# Patient Record
Sex: Male | Born: 1940
Health system: Southern US, Community
[De-identification: ages and names within clinical notes are randomized; demographics above are authoritative.]

## PROBLEM LIST (undated history)

## (undated) DIAGNOSIS — Z872 Personal history of diseases of the skin and subcutaneous tissue: Secondary | ICD-10-CM

## (undated) DIAGNOSIS — H9190 Unspecified hearing loss, unspecified ear: Secondary | ICD-10-CM

## (undated) DIAGNOSIS — E785 Hyperlipidemia, unspecified: Secondary | ICD-10-CM

## (undated) DIAGNOSIS — I10 Essential (primary) hypertension: Secondary | ICD-10-CM

## (undated) HISTORY — PX: TONSILLECTOMY: SHX5217

## (undated) HISTORY — DX: Personal history of diseases of the skin and subcutaneous tissue: Z87.2

## (undated) HISTORY — DX: Hyperlipidemia, unspecified: E78.5

## (undated) HISTORY — DX: Unspecified hearing loss, unspecified ear: H91.90

## (undated) HISTORY — PX: INNER EAR SURGERY: SHX679

## (undated) HISTORY — DX: Essential (primary) hypertension: I10

---

## 2009-02-08 ENCOUNTER — Ambulatory Visit: Payer: Self-pay | Admitting: Family Medicine

## 2009-02-16 ENCOUNTER — Ambulatory Visit: Payer: Self-pay | Admitting: Family Medicine

## 2009-06-13 ENCOUNTER — Encounter: Payer: Self-pay | Admitting: Internal Medicine

## 2009-06-19 ENCOUNTER — Ambulatory Visit: Payer: Self-pay | Admitting: Internal Medicine

## 2009-06-19 DIAGNOSIS — H9190 Unspecified hearing loss, unspecified ear: Secondary | ICD-10-CM

## 2009-06-19 HISTORY — DX: Unspecified hearing loss, unspecified ear: H91.90

## 2009-06-19 LAB — CONVERTED CEMR LAB
Bilirubin Urine: NEGATIVE
Ketones, urine, test strip: NEGATIVE
Nitrite: NEGATIVE
Specific Gravity, Urine: 1.01
Urobilinogen, UA: 0.2

## 2009-06-22 ENCOUNTER — Encounter: Payer: Self-pay | Admitting: Internal Medicine

## 2009-07-27 ENCOUNTER — Encounter: Payer: Self-pay | Admitting: Internal Medicine

## 2009-08-07 ENCOUNTER — Encounter: Payer: Self-pay | Admitting: Internal Medicine

## 2009-08-09 ENCOUNTER — Encounter: Payer: Self-pay | Admitting: Internal Medicine

## 2009-09-13 ENCOUNTER — Encounter: Payer: Self-pay | Admitting: Internal Medicine

## 2009-09-16 ENCOUNTER — Encounter: Payer: Self-pay | Admitting: Internal Medicine

## 2009-09-19 ENCOUNTER — Encounter: Payer: Self-pay | Admitting: Internal Medicine

## 2009-09-24 ENCOUNTER — Telehealth: Payer: Self-pay | Admitting: Internal Medicine

## 2009-10-05 ENCOUNTER — Telehealth: Payer: Self-pay | Admitting: Internal Medicine

## 2009-11-02 ENCOUNTER — Encounter: Payer: Self-pay | Admitting: Internal Medicine

## 2009-12-14 ENCOUNTER — Ambulatory Visit: Payer: Self-pay | Admitting: Internal Medicine

## 2010-08-12 ENCOUNTER — Telehealth: Payer: Self-pay | Admitting: Family Medicine

## 2010-08-15 ENCOUNTER — Ambulatory Visit: Payer: Self-pay | Admitting: Family Medicine

## 2010-08-15 LAB — CONVERTED CEMR LAB: Prothrombin Time: 10.6 s (ref 9.7–11.8)

## 2010-10-06 LAB — CONVERTED CEMR LAB
ALT: 22 units/L (ref 0–53)
BUN: 13 mg/dL (ref 6–23)
Bilirubin Urine: NEGATIVE
Bilirubin, Direct: 0.1 mg/dL (ref 0.0–0.3)
CO2: 30 meq/L (ref 19–32)
Chloride: 105 meq/L (ref 96–112)
Cholesterol: 206 mg/dL — ABNORMAL HIGH (ref 0–200)
Creatinine, Ser: 0.9 mg/dL (ref 0.4–1.5)
Direct LDL: 138.5 mg/dL
Eosinophils Absolute: 0.3 10*3/uL (ref 0.0–0.7)
Eosinophils Relative: 5.3 % — ABNORMAL HIGH (ref 0.0–5.0)
Glucose, Bld: 82 mg/dL (ref 70–99)
Glucose, Urine, Semiquant: NEGATIVE
HCT: 41 % (ref 39.0–52.0)
Lymphs Abs: 1.5 10*3/uL (ref 0.7–4.0)
MCHC: 34.8 g/dL (ref 30.0–36.0)
MCV: 100.7 fL — ABNORMAL HIGH (ref 78.0–100.0)
Monocytes Absolute: 0.4 10*3/uL (ref 0.1–1.0)
Neutrophils Relative %: 57.5 % (ref 43.0–77.0)
PSA: 2.77 ng/mL (ref 0.10–4.00)
Platelets: 139 10*3/uL — ABNORMAL LOW (ref 150.0–400.0)
Potassium: 4.4 meq/L (ref 3.5–5.1)
Protein, U semiquant: NEGATIVE
RDW: 12.4 % (ref 11.5–14.6)
Specific Gravity, Urine: 1.025
TSH: 2.57 microintl units/mL (ref 0.35–5.50)
Total Bilirubin: 1.2 mg/dL (ref 0.3–1.2)
VLDL: 33.4 mg/dL (ref 0.0–40.0)
WBC Urine, dipstick: NEGATIVE
pH: 6

## 2010-10-09 NOTE — Progress Notes (Signed)
Summary: PHONE  Phone Note Call from Patient Call back at Work Phone [phone]   Caller: Patient Summary of Call: PATIENT IS CALLING AND WANTS TO KNOW IF DR Deneise Getty SENT THE LETTER FROM ENT IN REFERENCE TO THE W-22 WORD LIST. PATIENT WOULD LIKE A CALL BACK. PLEASE ADVISE Initial call taken by: Barb Merino,  October 05, 2009 3:00 PM  Follow-up for Phone Call        dr Kanaya Gunnarson pls advise   if LETTER FROM DR. DELEGRANGE ABOUT HIS 3RD AUDIOLOGIC TEST, HAS BEEN  SENT TO  FAA.............Marland KitchenFelecia Deloach CMA  October 05, 2009 3:13 PM   Additional Follow-up for Phone Call Additional follow up Details #1::        all data sent to [facility] recommending certificate be issued Additional Follow-up by: Marga Melnick MD,  October 05, 2009 4:41 PM    Additional Follow-up for Phone Call Additional follow up Details #2::    pt aware..............Marland KitchenFelecia Deloach CMA  October 05, 2009 5:03 PM

## 2010-10-09 NOTE — Therapy (Signed)
Summary: Audiometry/[facility]  Audiometry/[facility]   Imported By: Lanelle Bal 10/03/2009 11:37:57  _____________________________________________________________________  External Attachment:    Type:   Image     Comment:   External Document

## 2010-10-09 NOTE — Progress Notes (Signed)
Summary: LETTER TO FAA  Phone Note Call from Patient Call back at Work Phone [phone]   Caller: Patient Call For: Nahiara Kretzschmar Reason for Call: Talk to Nurse Summary of Call: PATIENT IS CALLING, STATES HE IS A FRIEND & PT OF DR. Chanie Soucek.  PT REQUESTS TO SPEAK W/DR. Raymie Trani TO IF HE HAS REC'D A LETTER FROM DR. DELEGRANGE ABOUT HIS 3RD AUDIOLOGIC TEST, AND IF SO, DID DR. Kellianne Ek SEND TO THE FAA.  PT REQUESTS DR. Charisma Charlot CALL HIM ASAP AT [phone] PLEASE.  Initial call taken by: Magdalen Spatz [facility],  September 24, 2009 9:44 AM  Follow-up for Phone Call        I recived all data ; I'llget it to [facility] ASAP Follow-up by: Marga Melnick MD,  September 24, 2009 1:26 PM  Additional Follow-up for Phone Call Additional follow up Details #1::        pt aware.................Marland KitchenFelecia Deloach CMA  September 24, 2009 2:51 PM

## 2010-10-09 NOTE — Letter (Signed)
Summary: E-Mail from Patient Regarding Hearing Exam for FAA Certification  E-Mail from Patient Regarding Hearing Exam for FAA Certification   Imported By: Lanelle Bal 09/25/2009 09:09:16  _____________________________________________________________________  External Attachment:    Type:   Image     Comment:   External Document

## 2010-10-09 NOTE — Letter (Signed)
Summary: Medical Certification Letter/FAA  Medical Certification Letter/FAA   Imported By: Lanelle Bal 11/15/2009 13:55:09  _____________________________________________________________________  External Attachment:    Type:   Image     Comment:   External Document

## 2010-10-09 NOTE — Assessment & Plan Note (Signed)
Summary: chest congestion/ mbw   Primary Provider/Referring Provider:  Alonza Smoker  CC:  Pulmonary new pt-chest congestion.Marland Kitchen  History of Present Illness: December 14, 2009- 69 yoM self referred at request of his wife because of an acute chest syndrome they are sharing. Never smoked. No obvious sick contact, but got chilled recently on trip to [facility]. Woke 1 week ago with raw feeling in chest, tussive substernal pain, fever, chills, yellow sputum. took bufferin and fever abated. Cough improved and he is most concerned (i.e. wife is concerned) about lingering weakness, anorexia. Pain is almost gone. Not dyspneic.  He had been having a persistent dry cough attributed to nasal drip for a few months- he minimizes. Has no pulmonary history and has not had flu vax or pneumonia vax.  He is a Occupational hygienist. FAA evaluation previously for eustachian dysfunction which he says has improved.  Current Medications (verified): 1)  Aspirin 81 Mg Tbec (Aspirin) .... 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  The patient denies shortness of breath with activity, shortness of breath at rest, non-productive cough, coughing up blood, chest pain, irregular heartbeats, acid heartburn, indigestion, weight change, abdominal pain, difficulty swallowing, sore throat, tooth/dental problems, headaches, sneezing, itching, ear ache, anxiety, depression, hand/feet swelling, joint stiffness or pain, rash, change in color of mucus, and fever.    Vital Signs:  Patient profile:   70 year old male Height:      69.5 inches Weight:      191.25 pounds O2 Sat:      95 % on Room air Pulse rate:   78 / minute BP sitting:   120 / 62  (left arm) Cuff size:   regular  Vitals Entered By: Reynaldo Minium CMA (December 14, 2009 1:32 PM)  O2 Flow:  Room air  Physical Exam  Additional Exam:  General: A/Ox3; pleasant and cooperative, NAD, wdwn SKIN: no rash, lesions NODES: no lymphadenopathy HEENT: Pope/AT, EOM- WNL, Conjuctivae- clear, PERRLA, TM-WNL, Nose- clear, Throat- clear and wnl NECK: Supple w/ fair ROM, JVD- none, normal carotid impulses w/o bruits Thyroid- normal to palpation CHEST: Clear to P&A, but raspy cough HEART: RRR, no m/g/r heard ABDOMEN: Soft and nl; QMV:HQIO, nl pulses, no edema  NEURO: Grossly intact to observation      Impression & Recommendations:  Problem # 1:  BRONCHITIS, ACUTE (ICD-466.0)  He is resolving an acute bronchitis. Suggest fluids, mucinex if needed, Zpak, pneumovax.  His updated medication list for this problem includes:    Zithromax Z-pak 250 Mg Tabs (Azithromycin) .Marland Kitchen... 2 today then one daily  Medications Added to Medication List This Visit: 1)  Aspirin  81 Mg Tbec (Aspirin) .... Take 2 by mouth  every morning 2)  Zithromax Z-pak 250 Mg Tabs (Azithromycin) .... 2 today then one daily  Other Orders: New Patient Level III (04540) Pneumococcal Vaccine (98119) Admin 1st Vaccine (14782)  Patient  Instructions: 1)  Please schedule a follow-up appointment as needed. 2)  Pneumovax 3)  Fluids, consider mucinex if needed for thick mucus 4)  script for Z pak Prescriptions: ZITHROMAX Z-PAK 250 MG TABS (AZITHROMYCIN) 2 today then one daily  #1 pak x 0   Entered and Authorized by:   Waymon Budge MD   Signed by:   Waymon Budge MD on 12/14/2009   Method used:   Print then Give to Patient   RxID:   [id number]    Immunizations Administered:  Pneumonia Vaccine:    Vaccine Type: Pneumovax    Site: left deltoid    Mfr: Merck    Dose: 0.5 ml    Route: IM    Given by: Randell Loop CMA    Exp. Date: 04/20/2011    Lot #: [id number]    VIS given: 04/05/96 version given December 14, 2009.

## 2010-10-09 NOTE — Letter (Signed)
Summary: Letter Regarding Audiology Records & FAA Certification/Terry Del  Letter Regarding Audiology Records & FAA Certification/Terry Delagrange MD   Imported By: Lanelle Bal 10/03/2009 11:56:55  _____________________________________________________________________  External Attachment:    Type:   Image     Comment:   External Document

## 2010-10-09 NOTE — Therapy (Signed)
Summary: Audiometry/[facility]  Audiometry/[facility]   Imported By: Lanelle Bal 10/03/2009 11:50:38  _____________________________________________________________________  External Attachment:    Type:   Image     Comment:   External Document

## 2010-10-09 NOTE — Letter (Signed)
Summary: Medical Certificate & Student Pilot Certificate/FAA  Medical Certificate & Student Pilot Certificate/FAA   Imported By: Lanelle Bal 10/05/2009 11:56:02  _____________________________________________________________________  External Attachment:    Type:   Image     Comment:   External Document

## 2010-10-09 NOTE — Therapy (Signed)
Summary: Audiometry/[facility]  Audiometry/[facility]   Imported By: Lanelle Bal 10/03/2009 11:49:32  _____________________________________________________________________  External Attachment:    Type:   Image     Comment:   External Document

## 2010-10-09 NOTE — Letter (Signed)
Summary: E-Mail  Regarding Hearing Eval for FAA Certification  E-Mail  Regarding Hearing Eval for FAA Certification   Imported By: Lanelle Bal 09/25/2009 10:43:19  _____________________________________________________________________  External Attachment:    Type:   Image     Comment:   External Document

## 2010-10-09 NOTE — Progress Notes (Signed)
Summary: REQUEST FOR LABWORK  Phone Note Call from Patient   Caller: Patient  -   [phone] Summary of Call: Pt adv that he has an order from Dr. Trixie Rude, [address]...Marland KitchenMarland KitchenMarland Kitchen Pt will be having surgery and has to have labwork (PT, PTT).... would like to have labwork done at [facility] or [facility] location..... Can you advise okay for labwork?  Initial call taken by: Debbra Riding,  August 12, 2010 3:24 PM  Follow-up for Phone Call        since he is a patient here we do all his lab work  Follow-up by: Roderick Pee MD,  August 12, 2010 5:54 PM  Additional Follow-up for Phone Call Additional follow up Details #1::        Contacted pt and lab appt was scheduled for 12/8 at [facility] location.  Additional Follow-up by: Debbra Riding,  August 13, 2010 8:02 AM

## 2010-10-09 NOTE — Letter (Signed)
Summary: [facility]  [facility]   Imported By: Lanelle Bal 10/03/2009 11:52:45  _____________________________________________________________________  External Attachment:    Type:   Image     Comment:   External Document

## 2011-02-12 ENCOUNTER — Encounter: Payer: Self-pay | Admitting: Family Medicine

## 2011-02-13 ENCOUNTER — Encounter: Payer: Self-pay | Admitting: Family Medicine

## 2011-02-13 ENCOUNTER — Ambulatory Visit (INDEPENDENT_AMBULATORY_CARE_PROVIDER_SITE_OTHER): Payer: Medicare Other | Admitting: Family Medicine

## 2011-02-13 VITALS — BP 120/84 | Temp 98.3°F | Ht 69.25 in | Wt 190.0 lb

## 2011-02-13 DIAGNOSIS — Z125 Encounter for screening for malignant neoplasm of prostate: Secondary | ICD-10-CM

## 2011-02-13 DIAGNOSIS — Z0000 Encounter for general adult medical examination without abnormal findings: Secondary | ICD-10-CM

## 2011-02-13 LAB — POCT URINALYSIS DIPSTICK
Ketones, UA: NEGATIVE
Leukocytes, UA: NEGATIVE
Protein, UA: NEGATIVE
Urobilinogen, UA: 0.2
pH, UA: 5.5

## 2011-02-13 LAB — CBC WITH DIFFERENTIAL/PLATELET
Basophils Absolute: 0 10*3/uL (ref 0.0–0.1)
Basophils Relative: 0.3 % (ref 0.0–3.0)
Eosinophils Absolute: 0.1 10*3/uL (ref 0.0–0.7)
Hemoglobin: 14.8 g/dL (ref 13.0–17.0)
MCHC: 35 g/dL (ref 30.0–36.0)
MCV: 99.8 fl (ref 78.0–100.0)
Monocytes Absolute: 0.4 10*3/uL (ref 0.1–1.0)
Neutro Abs: 3.8 10*3/uL (ref 1.4–7.7)
RBC: 4.22 Mil/uL (ref 4.22–5.81)
RDW: 13.3 % (ref 11.5–14.6)

## 2011-02-13 LAB — HEPATIC FUNCTION PANEL
Albumin: 4.6 g/dL (ref 3.5–5.2)
Total Protein: 7.4 g/dL (ref 6.0–8.3)

## 2011-02-13 LAB — LIPID PANEL
HDL: 51.9 mg/dL (ref >39.00)
Total CHOL/HDL Ratio: 5
Triglycerides: 101 mg/dL (ref 0.0–149.0)
VLDL: 20.2 mg/dL (ref 0.0–40.0)

## 2011-02-13 LAB — PSA: PSA: 3.6 ng/mL (ref 0.10–4.00)

## 2011-02-13 MED ORDER — SILDENAFIL CITRATE 50 MG PO TABS: 50.0000 mg | ORAL_TABLET | ORAL | Status: AC | PRN

## 2011-02-13 NOTE — Patient Instructions (Signed)
Continue good health habits.  We will get u  setup for a screening colonoscopy and consider the shingles  Return in one year for general medical exam, sooner if any problems.  viagra 50 mg ,,,,,,,,,,,,,,,, one half tablet one hour prior to sex with water

## 2011-02-13 NOTE — Progress Notes (Signed)
  Subjective:    Patient ID: Anthony Mendez, male    DOB: Dec 24, 1940, 70 y.o.   MRN: [id number]  HPI [name] is a delightful 70 year old, married man nonsmoker, who comes in today for general Medicare wellness examination.  Is always been in excellent health.  He said no chronic health problems.  He takes no medication on a regular basis except an 81 mg baby aspirin.  He gets routine eye care, dental care, tetanus, 2005, Pneumovax, x 2, information given on shingles.  Vaccinations and will set him up for colonoscopy.  He does see the dermatologist yearly because he has chronic sun damage.  He is a [employer] manager.  And he has slight skin in line 9.  He still works on a daily basis.  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  No guns in the house, home safety reviewed.  No issues identified.  He does have a living will and a healthcare power of attorney.  He exercises on a regular basis.   Review of Systems  Constitutional: Negative.   HENT: Negative.   Eyes: Negative.   Respiratory: Negative.   Cardiovascular: Negative.   Gastrointestinal: Negative.   Genitourinary: Negative.   Musculoskeletal: Negative.   Skin: Negative.   Neurological: Negative.   Hematological: Negative.   Psychiatric/Behavioral: Negative.        Objective:   Physical Exam  Constitutional: He is oriented to person, place, and time. He appears well-developed and well-nourished.  HENT:  Head: Normocephalic and atraumatic.  Right Ear: External ear normal.  Left Ear: External ear normal.  Nose: Nose normal.  Mouth/Throat: Oropharynx is clear and moist.  Eyes: Conjunctivae and EOM are normal. Pupils are equal, round, and reactive to light.  Neck: Normal range of motion. Neck supple. No JVD present. No tracheal deviation present. No thyromegaly present.  Cardiovascular: Normal rate, regular rhythm, normal heart sounds and intact distal pulses.  Exam reveals no gallop and no friction rub.   No murmur  heard. Pulmonary/Chest: Effort normal and breath sounds normal. No stridor. No respiratory distress. He has no wheezes. He has no rales. He exhibits no tenderness.  Abdominal: Soft. Bowel sounds are normal. He exhibits no distension and no mass. There is no tenderness. There is no rebound and no guarding.  Genitourinary: Rectum normal, prostate normal and penis normal. Guaiac negative stool. No penile tenderness.  Musculoskeletal: Normal range of motion. He exhibits no edema and no tenderness.  Lymphadenopathy:    He has no cervical adenopathy.  Neurological: He is alert and oriented to person, place, and time. He has normal reflexes. No cranial nerve deficit. He exhibits normal muscle tone.  Skin: Skin is warm and dry. No rash noted. No erythema. No pallor.       Chronic sun damage  Psychiatric: He has a normal mood and affect. His behavior is normal. Judgment and thought content normal.          Assessment & Plan:  Healthy male.  Mild ED,,,,,,,,,v prn   Chronic sun damage.  Recommend sunscreens yearly dermatologic evaluation

## 2011-02-28 NOTE — Progress Notes (Signed)
Left message on machine for patient

## 2011-04-14 ENCOUNTER — Encounter: Payer: Self-pay | Admitting: Gastroenterology

## 2012-04-02 ENCOUNTER — Other Ambulatory Visit (INDEPENDENT_AMBULATORY_CARE_PROVIDER_SITE_OTHER): Payer: Medicare Other

## 2012-04-02 DIAGNOSIS — Z125 Encounter for screening for malignant neoplasm of prostate: Secondary | ICD-10-CM

## 2012-04-02 DIAGNOSIS — Z0000 Encounter for general adult medical examination without abnormal findings: Secondary | ICD-10-CM

## 2012-04-02 DIAGNOSIS — Z79899 Other long term (current) drug therapy: Secondary | ICD-10-CM

## 2012-04-02 LAB — HEPATIC FUNCTION PANEL
Albumin: 4 g/dL (ref 3.5–5.2)
Total Bilirubin: 0.8 mg/dL (ref 0.3–1.2)

## 2012-04-02 LAB — CBC WITH DIFFERENTIAL/PLATELET
Basophils Absolute: 0 10*3/uL (ref 0.0–0.1)
Eosinophils Absolute: 0.3 10*3/uL (ref 0.0–0.7)
HCT: 41.6 % (ref 39.0–52.0)
Hemoglobin: 13.9 g/dL (ref 13.0–17.0)
Lymphocytes Relative: 26.2 % (ref 12.0–46.0)
Lymphs Abs: 1.4 10*3/uL (ref 0.7–4.0)
MCHC: 33.4 g/dL (ref 30.0–36.0)
Neutro Abs: 3.2 10*3/uL (ref 1.4–7.7)
Platelets: 131 10*3/uL — ABNORMAL LOW (ref 150.0–400.0)
RDW: 13.2 % (ref 11.5–14.6)

## 2012-04-02 LAB — POCT URINALYSIS DIPSTICK
Bilirubin, UA: NEGATIVE
Blood, UA: NEGATIVE
Glucose, UA: NEGATIVE
Leukocytes, UA: NEGATIVE
Nitrite, UA: NEGATIVE

## 2012-04-02 LAB — BASIC METABOLIC PANEL
BUN: 13 mg/dL (ref 6–23)
CO2: 26 mEq/L (ref 19–32)
Calcium: 9.3 mg/dL (ref 8.4–10.5)
GFR: 81.94 mL/min (ref >60.00)
Glucose, Bld: 104 mg/dL — ABNORMAL HIGH (ref 70–99)
Sodium: 138 mEq/L (ref 135–145)

## 2012-04-02 LAB — LIPID PANEL
Cholesterol: 206 mg/dL — ABNORMAL HIGH (ref 0–200)
HDL: 47.5 mg/dL (ref >39.00)
Triglycerides: 183 mg/dL — ABNORMAL HIGH (ref 0.0–149.0)

## 2012-04-02 LAB — PSA: PSA: 2.92 ng/mL (ref 0.10–4.00)

## 2012-04-02 LAB — TSH: TSH: 1.98 u[IU]/mL (ref 0.35–5.50)

## 2012-04-08 ENCOUNTER — Other Ambulatory Visit: Payer: Medicare Other

## 2012-04-22 ENCOUNTER — Encounter: Payer: Self-pay | Admitting: Family Medicine

## 2012-04-22 ENCOUNTER — Ambulatory Visit (INDEPENDENT_AMBULATORY_CARE_PROVIDER_SITE_OTHER): Payer: Medicare Other | Admitting: Family Medicine

## 2012-04-22 VITALS — BP 124/80 | Temp 98.3°F | Ht 70.25 in | Wt 195.0 lb

## 2012-04-22 DIAGNOSIS — H9190 Unspecified hearing loss, unspecified ear: Secondary | ICD-10-CM

## 2012-04-22 DIAGNOSIS — Z0000 Encounter for general adult medical examination without abnormal findings: Secondary | ICD-10-CM | POA: Insufficient documentation

## 2012-04-22 MED ORDER — FLUOCINONIDE 0.05 % EX SOLN: CUTANEOUS | Status: DC

## 2012-04-22 NOTE — Progress Notes (Signed)
  Subjective:    Patient ID: Anthony Mendez, male    DOB: 1941/07/07, 71 y.o.   MRN: [id number]  HPI Anthony Mendez is a 71 year old married male nonsmoker who comes in today for a Medicare wellness examination  He takes no medication for any chronic health problems. He does take an aspirin tablet daily for cardiac protection. His weight is stable blood pressure is normal blood sugar cholesterol lipids all have been within normal range for many years.  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   HENT: Negative.   Eyes: Negative.   Respiratory: Negative.   Cardiovascular: Negative.   Gastrointestinal: Negative.   Genitourinary: Negative.   Musculoskeletal: Negative.   Skin: Negative.   Neurological: Negative.   Hematological: Negative.   Psychiatric/Behavioral: Negative.        Objective:   Physical Exam  Constitutional: He is oriented to person, place, and time. He appears well-developed and well-nourished.  HENT:  Head: Normocephalic and atraumatic.  Right Ear: External ear normal.  Left Ear: External ear normal.  Nose: Nose normal.  Mouth/Throat: Oropharynx is clear and moist.       Irritated ear canals also some slight hearing loss  Eyes: Conjunctivae and EOM are normal. Pupils are equal, round, and reactive to light.  Neck: Normal range of motion. Neck supple. No JVD present. No tracheal deviation present. No thyromegaly present.  Cardiovascular: Normal rate, regular rhythm, normal heart sounds and intact distal pulses.  Exam reveals no gallop and no  friction rub.   No murmur heard. Pulmonary/Chest: Effort normal and breath sounds normal. No stridor. No respiratory distress. He has no wheezes. He has no rales. He exhibits no tenderness.  Abdominal: Soft. Bowel sounds are normal. He exhibits no distension and no mass. There is no tenderness. There is no rebound and no guarding.  Genitourinary: Rectum normal, prostate normal and penis normal. Guaiac negative stool. No penile tenderness.  Musculoskeletal: Normal range of motion. He exhibits no edema and no tenderness.  Lymphadenopathy:    He has no cervical adenopathy.  Neurological: He is alert and oriented to person, place, and time. He has normal reflexes. No cranial nerve deficit. He exhibits normal muscle tone.  Skin: Skin is warm and dry. No rash noted. No erythema. No pallor.  Psychiatric: He has a normal mood and affect. His behavior is normal. Judgment and thought content normal.          Assessment & Plan:  Healthy male  Otitis externa steroid drops  Hearing loss suggests hearing evaluation

## 2012-04-22 NOTE — Patient Instructions (Signed)
Remember to use your SPF 50+ sunscreens  Continue good health habits  The steroid solution 1 drop in each ear canal bedtime when necessary  Return in one year sooner if any problems

## 2013-04-27 ENCOUNTER — Other Ambulatory Visit: Payer: Medicare Other

## 2013-05-03 ENCOUNTER — Encounter: Payer: Medicare Other | Admitting: Family Medicine

## 2013-05-26 ENCOUNTER — Other Ambulatory Visit: Payer: Medicare Other

## 2013-05-31 ENCOUNTER — Encounter: Payer: Medicare Other | Admitting: Family Medicine

## 2013-06-22 ENCOUNTER — Encounter: Payer: Medicare Other | Admitting: Family Medicine

## 2013-07-18 ENCOUNTER — Other Ambulatory Visit (INDEPENDENT_AMBULATORY_CARE_PROVIDER_SITE_OTHER): Payer: Medicare Other

## 2013-07-18 DIAGNOSIS — Z125 Encounter for screening for malignant neoplasm of prostate: Secondary | ICD-10-CM

## 2013-07-18 DIAGNOSIS — Z0000 Encounter for general adult medical examination without abnormal findings: Secondary | ICD-10-CM

## 2013-07-18 DIAGNOSIS — Z136 Encounter for screening for cardiovascular disorders: Secondary | ICD-10-CM

## 2013-07-18 LAB — CBC WITH DIFFERENTIAL/PLATELET
Basophils Absolute: 0 10*3/uL (ref 0.0–0.1)
Eosinophils Absolute: 0.2 10*3/uL (ref 0.0–0.7)
Lymphocytes Relative: 26.5 % (ref 12.0–46.0)
MCHC: 34.3 g/dL (ref 30.0–36.0)
MCV: 99.2 fl (ref 78.0–100.0)
Monocytes Absolute: 0.3 10*3/uL (ref 0.1–1.0)
Neutrophils Relative %: 60.8 % (ref 43.0–77.0)
RDW: 13.6 % (ref 11.5–14.6)

## 2013-07-18 LAB — HEPATIC FUNCTION PANEL
Bilirubin, Direct: 0.1 mg/dL (ref 0.0–0.3)
Total Bilirubin: 1.1 mg/dL (ref 0.3–1.2)
Total Protein: 6.7 g/dL (ref 6.0–8.3)

## 2013-07-18 LAB — POCT URINALYSIS DIPSTICK
Bilirubin, UA: NEGATIVE
Ketones, UA: NEGATIVE
Leukocytes, UA: NEGATIVE
Protein, UA: NEGATIVE
Spec Grav, UA: 1.025

## 2013-07-18 LAB — LIPID PANEL
HDL: 49.5 mg/dL (ref >39.00)
Triglycerides: 124 mg/dL (ref 0.0–149.0)

## 2013-07-18 LAB — BASIC METABOLIC PANEL
BUN: 12 mg/dL (ref 6–23)
CO2: 28 mEq/L (ref 19–32)
Calcium: 9.3 mg/dL (ref 8.4–10.5)
Creatinine, Ser: 0.9 mg/dL (ref 0.4–1.5)
Glucose, Bld: 92 mg/dL (ref 70–99)

## 2013-07-18 LAB — LDL CHOLESTEROL, DIRECT: Direct LDL: 170.1 mg/dL

## 2013-07-18 LAB — PSA: PSA: 2.39 ng/mL (ref 0.10–4.00)

## 2013-07-21 ENCOUNTER — Ambulatory Visit (INDEPENDENT_AMBULATORY_CARE_PROVIDER_SITE_OTHER): Payer: Medicare Other | Admitting: Family Medicine

## 2013-07-21 ENCOUNTER — Encounter: Payer: Self-pay | Admitting: Family Medicine

## 2013-07-21 VITALS — BP 120/78 | Temp 98.0°F | Ht 70.0 in | Wt 198.0 lb

## 2013-07-21 DIAGNOSIS — Z0000 Encounter for general adult medical examination without abnormal findings: Secondary | ICD-10-CM

## 2013-07-21 DIAGNOSIS — H9190 Unspecified hearing loss, unspecified ear: Secondary | ICD-10-CM

## 2013-07-21 NOTE — Progress Notes (Signed)
  Subjective:    Patient ID: Anthony Mendez, male    DOB: Oct 28, 1940, 72 y.o.   MRN: [id number]  HPI [name] is a delightful 72 year old married male nonsmoker still working,,,,,,,, Hydrographic surveyor business,,,,, who comes in today for a Medicare wellness examination  He's always been in excellent health he said no chronic health problems. He takes no medication except for an aspirin tablet daily  He gets routine eye care, dental care, colonoscopy and GI, vaccinations up-to-date except she declines a flu shot. Information given on shingles.  Cognitive function normal he walks on a regular basis home health safety reviewed no issues identified, no guns in the house, he does have a health care power of attorney and living will  He runs his own business spends a lot of time with his wife Francena Hanly.Marland KitchenMarland KitchenMarland KitchenMarland Kitchen who also is still working...... and they love to bike walk and to have a sailboat.,   Review of Systems  Constitutional: Negative.   HENT: Negative.   Eyes: Negative.   Respiratory: Negative.   Cardiovascular: Negative.   Gastrointestinal: Negative.   Endocrine: Negative.   Genitourinary: Negative.   Musculoskeletal: Negative.   Skin: Negative.   Allergic/Immunologic: Negative.   Neurological: Negative.   Hematological: Negative.   Psychiatric/Behavioral: Negative.        Objective:   Physical Exam  Nursing note and vitals reviewed. Constitutional: He is oriented to person, place, and time. He appears well-developed and well-nourished.  HENT:  Head: Normocephalic and atraumatic.  Right Ear: External ear normal.  Left Ear: External ear normal.  Nose: Nose normal.  Mouth/Throat: Oropharynx is clear and moist.  Eyes: Conjunctivae and EOM are normal. Pupils are equal, round, and reactive to light.  Neck: Normal range of motion. Neck supple. No JVD present. No tracheal deviation present. No thyromegaly present.  Cardiovascular: Normal rate, regular rhythm, normal heart sounds and  intact distal pulses.  Exam reveals no gallop and no friction rub.   No murmur heard. No carotid artery bruits peripheral pulses 2+ and symmetrical  Pulmonary/Chest: Effort normal and breath sounds normal. No stridor. No respiratory distress. He has no wheezes. He has no rales. He exhibits no tenderness.  Abdominal: Soft. Bowel sounds are normal. He exhibits no distension and no mass. There is no tenderness. There is no rebound and no guarding.  Genitourinary: Rectum normal, prostate normal and penis normal. Guaiac negative stool. No penile tenderness.  Musculoskeletal: Normal range of motion. He exhibits no edema and no tenderness.  Lymphadenopathy:    He has no cervical adenopathy.  Neurological: He is alert and oriented to person, place, and time. He has normal reflexes. No cranial nerve deficit. He exhibits normal muscle tone.  Skin: Skin is warm and dry. No rash noted. No erythema. No pallor.  Chronic sun damage garden variety of freckles moles seborrheic keratosis none of which appear to be abnormal  Psychiatric: He has a normal mood and affect. His behavior is normal. Judgment and thought content normal.          Assessment & Plan:Healthy male  Bilateral hearing loss  Chronic sun damage

## 2013-07-21 NOTE — Patient Instructions (Signed)
Remember to use sunscreens SPF 50+ a hat  Return in one year for general medical exam sooner if any problems

## 2013-07-21 NOTE — Progress Notes (Signed)
Pre visit review using our clinic review tool, if applicable. No additional management support is needed unless otherwise documented below in the visit note. 

## 2014-07-25 ENCOUNTER — Ambulatory Visit (INDEPENDENT_AMBULATORY_CARE_PROVIDER_SITE_OTHER): Payer: Medicare Other | Admitting: Family Medicine

## 2014-07-25 ENCOUNTER — Encounter: Payer: Self-pay | Admitting: Family Medicine

## 2014-07-25 VITALS — BP 120/80 | Temp 97.9°F | Ht 70.0 in | Wt 191.0 lb

## 2014-07-25 DIAGNOSIS — Z0000 Encounter for general adult medical examination without abnormal findings: Secondary | ICD-10-CM

## 2014-07-25 DIAGNOSIS — L259 Unspecified contact dermatitis, unspecified cause: Secondary | ICD-10-CM

## 2014-07-25 DIAGNOSIS — H60541 Acute eczematoid otitis externa, right ear: Secondary | ICD-10-CM

## 2014-07-25 DIAGNOSIS — N400 Enlarged prostate without lower urinary tract symptoms: Secondary | ICD-10-CM

## 2014-07-25 DIAGNOSIS — R7989 Other specified abnormal findings of blood chemistry: Secondary | ICD-10-CM

## 2014-07-25 DIAGNOSIS — H9193 Unspecified hearing loss, bilateral: Secondary | ICD-10-CM

## 2014-07-25 DIAGNOSIS — Z79899 Other long term (current) drug therapy: Secondary | ICD-10-CM

## 2014-07-25 LAB — POCT URINALYSIS DIPSTICK
BILIRUBIN UA: NEGATIVE
Glucose, UA: NEGATIVE
Leukocytes, UA: NEGATIVE
Nitrite, UA: NEGATIVE
RBC UA: NEGATIVE
Spec Grav, UA: >=1.03
Urobilinogen, UA: 0.2
pH, UA: 5.5

## 2014-07-25 LAB — CBC WITH DIFFERENTIAL/PLATELET
Basophils Absolute: 0 10*3/uL (ref 0.0–0.1)
Basophils Relative: 0.4 % (ref 0.0–3.0)
Eosinophils Absolute: 0.2 10*3/uL (ref 0.0–0.7)
Eosinophils Relative: 4.3 % (ref 0.0–5.0)
HEMATOCRIT: 43.9 % (ref 39.0–52.0)
HEMOGLOBIN: 14.9 g/dL (ref 13.0–17.0)
LYMPHS ABS: 1.5 10*3/uL (ref 0.7–4.0)
Lymphocytes Relative: 30.1 % (ref 12.0–46.0)
MCHC: 33.9 g/dL (ref 30.0–36.0)
MCV: 100.3 fl — ABNORMAL HIGH (ref 78.0–100.0)
MONOS PCT: 8.7 % (ref 3.0–12.0)
Monocytes Absolute: 0.4 10*3/uL (ref 0.1–1.0)
NEUTROS ABS: 2.9 10*3/uL (ref 1.4–7.7)
Neutrophils Relative %: 56.5 % (ref 43.0–77.0)
Platelets: 141 10*3/uL — ABNORMAL LOW (ref 150.0–400.0)
RBC: 4.38 Mil/uL (ref 4.22–5.81)
RDW: 13 % (ref 11.5–15.5)
WBC: 5.1 10*3/uL (ref 4.0–10.5)

## 2014-07-25 LAB — TSH: TSH: 2.11 u[IU]/mL (ref 0.35–4.50)

## 2014-07-25 LAB — PSA: PSA: 5.02 ng/mL — AB (ref 0.10–4.00)

## 2014-07-25 MED ORDER — FLUOCINONIDE 0.05 % EX SOLN: CUTANEOUS | Status: DC

## 2014-07-25 NOTE — Patient Instructions (Signed)
Triamcinolone solution......... One drop in your right ear canal at bedtime.... Pack with cotton...Marland KitchenMarland KitchenMarland Kitchen Removed the cotton in the morning..... Do this for 6 weeks  Sunscreens as we discussed  Return in one year sooner if any problems

## 2014-07-25 NOTE — Progress Notes (Signed)
Pre visit review using our clinic review tool, if applicable. No additional management support is needed unless otherwise documented below in the visit note. 

## 2014-07-25 NOTE — Progress Notes (Signed)
   Subjective:    Patient ID: Anthony Mendez, male    DOB: 03-Jan-1941, 73 y.o.   MRN: [id number]  HPI Mr. Colpitts is a delightful 73 year old married male nonsmoker still working full-time who comes in today for general physical examination  He gets routine eye care, dental care, never had a colonoscopy. Did have a flexible sigmoidoscopy years ago which was normal. No history of any rectal problems bleeding etc.  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 He has light skin and light eyes and has a lot of sun damage.  He's got irritation of his right ear canal which is chronic. He's been to ENT still no resolution of the problem.   Review of Systems  Constitutional: Negative.   HENT: Negative.   Eyes: Negative.   Respiratory: Negative.   Cardiovascular: Negative.   Gastrointestinal: Negative.   Endocrine: Negative.   Genitourinary: Negative.   Musculoskeletal: Negative.   Skin: Negative.   Allergic/Immunologic: Negative.   Neurological: Negative.   Hematological: Negative.   Psychiatric/Behavioral: Negative.        Objective:   Physical Exam  Constitutional: He is oriented to person, place, and time. He appears well-developed and well-nourished.  HENT:  Head: Normocephalic and atraumatic.  Right Ear: External ear normal.  Left Ear: External ear normal.  Nose: Nose normal.  Mouth/Throat: Oropharynx is clear and moist.  Eyes: Conjunctivae and EOM are normal. Pupils are equal, round, and reactive to light.  Neck: Normal range of motion. Neck supple. No JVD present. No tracheal deviation present. No thyromegaly present.  Cardiovascular: Normal rate, regular rhythm, normal heart sounds and intact distal pulses.  Exam reveals no gallop and no  friction rub.   No murmur heard. No carotid nor aortic bruits peripheral pulses 2+ and symmetrical  Pulmonary/Chest: Effort normal and breath sounds normal. No stridor. No respiratory distress. He has no wheezes. He has no rales. He exhibits no tenderness.  Abdominal: Soft. Bowel sounds are normal. He exhibits no distension and no mass. There is no tenderness. There is no rebound and no guarding.  Genitourinary: Rectum normal and penis normal. Guaiac negative stool. No penile tenderness.  1+ symmetrical BPH  Musculoskeletal: Normal range of motion. He exhibits no edema or tenderness.  Lymphadenopathy:    He has no cervical adenopathy.  Neurological: He is alert and oriented to person, place, and time. He has normal reflexes. No cranial nerve deficit. He exhibits normal muscle tone.  Skin: Skin is warm and dry. No rash noted. No erythema. No pallor.  Total body skin exam he has light skin and light eyes and a lot of sun damage from his years of sailing. He has freckles moles seborrheic keratosis capillary hemangiomas but nothing I see that's abnormal  Psychiatric: He has a normal mood and affect. His behavior is normal. Judgment and thought content normal.  Nursing note and vitals reviewed.         Assessment & Plan:  Healthy male  Chronic sun damage,,,,,,,,, recommended yearly skin exam and sunscreens as outlined  Eczema,,,,,,,,,,, OTC cortisone cream and moisturizers  Dermatitis right ear canal,,,,,,,,,,,, Lidex solution daily at bedtime  BPH,,,,,,,,, no current therapy

## 2014-07-26 LAB — LIPID PANEL
Cholesterol: 238 mg/dL — ABNORMAL HIGH (ref 0–200)
HDL: 48.4 mg/dL (ref >39.00)
NONHDL: 189.6
Total CHOL/HDL Ratio: 5
Triglycerides: 249 mg/dL — ABNORMAL HIGH (ref 0.0–149.0)
VLDL: 49.8 mg/dL — ABNORMAL HIGH (ref 0.0–40.0)

## 2014-07-26 LAB — BASIC METABOLIC PANEL
BUN: 16 mg/dL (ref 6–23)
CHLORIDE: 109 meq/L (ref 96–112)
CO2: 23 mEq/L (ref 19–32)
Calcium: 9.6 mg/dL (ref 8.4–10.5)
Creatinine, Ser: 0.9 mg/dL (ref 0.4–1.5)
GFR: 86.59 mL/min (ref >60.00)
Glucose, Bld: 81 mg/dL (ref 70–99)
Potassium: 4.6 mEq/L (ref 3.5–5.1)
Sodium: 142 mEq/L (ref 135–145)

## 2014-07-26 LAB — HEPATIC FUNCTION PANEL
ALT: 21 U/L (ref 0–53)
AST: 21 U/L (ref 0–37)
Albumin: 4.5 g/dL (ref 3.5–5.2)
Alkaline Phosphatase: 48 U/L (ref 39–117)
BILIRUBIN DIRECT: 0.1 mg/dL (ref 0.0–0.3)
BILIRUBIN TOTAL: 1.2 mg/dL (ref 0.2–1.2)
Total Protein: 6.8 g/dL (ref 6.0–8.3)

## 2014-07-26 LAB — LDL CHOLESTEROL, DIRECT: LDL DIRECT: 145.9 mg/dL

## 2014-08-09 ENCOUNTER — Telehealth: Payer: Self-pay | Admitting: Family Medicine

## 2014-08-09 NOTE — Telephone Encounter (Signed)
Pt returning your call Pt would results of labs early am ok to call back

## 2014-08-10 NOTE — Telephone Encounter (Signed)
Wife is aware of lab results.

## 2014-10-23 ENCOUNTER — Other Ambulatory Visit: Payer: Medicare Other

## 2014-11-21 ENCOUNTER — Other Ambulatory Visit (INDEPENDENT_AMBULATORY_CARE_PROVIDER_SITE_OTHER): Payer: Medicare Other

## 2014-11-21 DIAGNOSIS — N400 Enlarged prostate without lower urinary tract symptoms: Secondary | ICD-10-CM | POA: Diagnosis not present

## 2014-11-21 LAB — PSA: PSA: 3.79 ng/mL (ref 0.10–4.00)

## 2015-06-04 ENCOUNTER — Telehealth: Payer: Self-pay | Admitting: Family Medicine

## 2015-06-04 NOTE — Telephone Encounter (Signed)
Pt would like to be work in for [facility] in nov. Pt states he is a friend on md. Can I create 30 min slot?

## 2015-06-04 NOTE — Telephone Encounter (Signed)
No per Dr Sherren Mocha await open time or switch to [facility]

## 2015-06-07 NOTE — Telephone Encounter (Signed)
Pt has been sch for 10-2015

## 2015-10-22 ENCOUNTER — Ambulatory Visit (INDEPENDENT_AMBULATORY_CARE_PROVIDER_SITE_OTHER): Payer: Medicare Other | Admitting: Family Medicine

## 2015-10-22 ENCOUNTER — Encounter: Payer: Self-pay | Admitting: Family Medicine

## 2015-10-22 VITALS — BP 120/84 | Temp 98.5°F | Ht 70.0 in | Wt 196.0 lb

## 2015-10-22 DIAGNOSIS — R351 Nocturia: Secondary | ICD-10-CM | POA: Diagnosis not present

## 2015-10-22 DIAGNOSIS — Z0001 Encounter for general adult medical examination with abnormal findings: Secondary | ICD-10-CM | POA: Diagnosis not present

## 2015-10-22 DIAGNOSIS — H9319 Tinnitus, unspecified ear: Secondary | ICD-10-CM | POA: Insufficient documentation

## 2015-10-22 DIAGNOSIS — N401 Enlarged prostate with lower urinary tract symptoms: Secondary | ICD-10-CM

## 2015-10-22 DIAGNOSIS — Z0000 Encounter for general adult medical examination without abnormal findings: Secondary | ICD-10-CM

## 2015-10-22 DIAGNOSIS — E785 Hyperlipidemia, unspecified: Secondary | ICD-10-CM

## 2015-10-22 DIAGNOSIS — Z23 Encounter for immunization: Secondary | ICD-10-CM | POA: Diagnosis not present

## 2015-10-22 DIAGNOSIS — H9313 Tinnitus, bilateral: Secondary | ICD-10-CM | POA: Diagnosis not present

## 2015-10-22 DIAGNOSIS — H9193 Unspecified hearing loss, bilateral: Secondary | ICD-10-CM | POA: Diagnosis not present

## 2015-10-22 LAB — BASIC METABOLIC PANEL
BUN: 14 mg/dL (ref 6–23)
CHLORIDE: 105 meq/L (ref 96–112)
CO2: 29 meq/L (ref 19–32)
CREATININE: 0.95 mg/dL (ref 0.40–1.50)
Calcium: 10 mg/dL (ref 8.4–10.5)
GFR: 82.12 mL/min (ref >60.00)
Glucose, Bld: 94 mg/dL (ref 70–99)
Potassium: 4.6 mEq/L (ref 3.5–5.1)
Sodium: 140 mEq/L (ref 135–145)

## 2015-10-22 LAB — HEPATIC FUNCTION PANEL
ALBUMIN: 4.5 g/dL (ref 3.5–5.2)
ALT: 21 U/L (ref 0–53)
AST: 20 U/L (ref 0–37)
Alkaline Phosphatase: 47 U/L (ref 39–117)
Bilirubin, Direct: 0.1 mg/dL (ref 0.0–0.3)
Total Bilirubin: 0.9 mg/dL (ref 0.2–1.2)
Total Protein: 6.9 g/dL (ref 6.0–8.3)

## 2015-10-22 LAB — CBC WITH DIFFERENTIAL/PLATELET
BASOS ABS: 0 10*3/uL (ref 0.0–0.1)
Basophils Relative: 0.5 % (ref 0.0–3.0)
Eosinophils Absolute: 0.3 10*3/uL (ref 0.0–0.7)
Eosinophils Relative: 5.3 % — ABNORMAL HIGH (ref 0.0–5.0)
HEMATOCRIT: 44.9 % (ref 39.0–52.0)
Hemoglobin: 15.2 g/dL (ref 13.0–17.0)
LYMPHS ABS: 1.5 10*3/uL (ref 0.7–4.0)
LYMPHS PCT: 28.3 % (ref 12.0–46.0)
MCHC: 33.8 g/dL (ref 30.0–36.0)
MCV: 101 fl — AB (ref 78.0–100.0)
MONOS PCT: 8.7 % (ref 3.0–12.0)
Monocytes Absolute: 0.5 10*3/uL (ref 0.1–1.0)
NEUTROS PCT: 57.2 % (ref 43.0–77.0)
Neutro Abs: 3 10*3/uL (ref 1.4–7.7)
Platelets: 141 10*3/uL — ABNORMAL LOW (ref 150.0–400.0)
RBC: 4.44 Mil/uL (ref 4.22–5.81)
RDW: 13.7 % (ref 11.5–15.5)
WBC: 5.3 10*3/uL (ref 4.0–10.5)

## 2015-10-22 LAB — POCT URINALYSIS DIPSTICK
Bilirubin, UA: NEGATIVE
Blood, UA: NEGATIVE
GLUCOSE UA: NEGATIVE
Ketones, UA: NEGATIVE
LEUKOCYTES UA: NEGATIVE
NITRITE UA: NEGATIVE
Protein, UA: NEGATIVE
SPEC GRAV UA: 1.025
UROBILINOGEN UA: 0.2
pH, UA: 5.5

## 2015-10-22 LAB — LIPID PANEL
CHOLESTEROL: 250 mg/dL — AB (ref 0–200)
HDL: 52.3 mg/dL (ref >39.00)
LDL Cholesterol: 169 mg/dL — ABNORMAL HIGH (ref 0–99)
NonHDL: 197.57
Total CHOL/HDL Ratio: 5
Triglycerides: 144 mg/dL (ref 0.0–149.0)
VLDL: 28.8 mg/dL (ref 0.0–40.0)

## 2015-10-22 LAB — PSA: PSA: 5.09 ng/mL — AB (ref 0.10–4.00)

## 2015-10-22 NOTE — Progress Notes (Signed)
Pre visit review using our clinic review tool, if applicable. No additional management support is needed unless otherwise documented below in the visit note. 

## 2015-10-22 NOTE — Progress Notes (Signed)
   Subjective:    Patient ID: Anthony Mendez, male    DOB: 06-Jan-1941, 75 y.o.   MRN: [id number]  HPI Anthony Mendez is a 75 year old married male nonsmoker who comes in today for general physical examination  He is an excellent health he has no chronic health problems.  He has some mild bilateral hearing declined and some tinnitus. He's able to sleep at night.  He gets routine eye care, dental care, last colonoscopy 5 years ago was normal he was told he did not need to come back  Vaccinations up-to-date except he is doing Pneumovax. He declines a flu shot.  Cognitive function normal he exercises on a daily basis home health safety reviewed no issues identified, no guns in the house, he does have a healthcare power of attorney and living well   Review of Systems  Constitutional: Negative.   HENT: Negative.   Eyes: Negative.   Respiratory: Negative.   Cardiovascular: Negative.   Gastrointestinal: Negative.   Endocrine: Negative.   Genitourinary: Negative.   Musculoskeletal: Negative.   Skin: Negative.   Allergic/Immunologic: Negative.   Neurological: Negative.   Hematological: Negative.   Psychiatric/Behavioral: Negative.        Objective:   Physical Exam  Constitutional: He is oriented to person, place, and time. He appears well-developed and well-nourished.  HENT:  Head: Normocephalic and atraumatic.  Right Ear: External ear normal.  Left Ear: External ear normal.  Nose: Nose normal.  Mouth/Throat: Oropharynx is clear and moist.  Bilateral hearing loss  Eyes: Conjunctivae and EOM are normal. Pupils are equal, round, and reactive to light.  Neck: Normal range of motion. Neck supple. No JVD present. No tracheal deviation present. No thyromegaly present.  Cardiovascular: Normal rate, regular rhythm, normal heart sounds and intact distal pulses.  Exam reveals no gallop and no friction rub.   No murmur heard. No carotid nor aortic bruits peripheral pulses 2+ and symmetrical    Pulmonary/Chest: Effort normal and breath sounds normal. No stridor. No respiratory distress. He has no wheezes. He has no rales. He exhibits no tenderness.  Abdominal: Soft. Bowel sounds are normal. He exhibits no distension and no mass. There is no tenderness. There is no rebound and no guarding.  Genitourinary: Rectum normal and penis normal. Guaiac negative stool. No penile tenderness.  3+ symmetrical nonnodular BPH........Marland Kitchen  Musculoskeletal: Normal range of motion. He exhibits no edema or tenderness.  Lymphadenopathy:    He has no cervical adenopathy.  Neurological: He is alert and oriented to person, place, and time. He has normal reflexes. No cranial nerve deficit. He exhibits normal muscle tone.  Skin: Skin is warm and dry. No rash noted. No erythema. No pallor.  Light skin and light eyes sun exposure,,,,,,,, his hobby is been sailing for many years,,,,,,,,,, I do not see any abnormal lesions  Psychiatric: He has a normal mood and affect. His behavior is normal. Judgment and thought content normal.  Nursing note and vitals reviewed.         Assessment & Plan:  Healthy male  Hearing loss..........Marland Kitchen recommended audiogram and consider hearing aids  Chronic sun damage............. continue sunscreens and yearly surveillance  BPH.......Marland Kitchen Asymptomatic....... therefore no therapy  Tinnitus....... observe.

## 2015-10-22 NOTE — Patient Instructions (Signed)
Call your insurance company and find out where you can get the shingles vaccine the cheapest  Remember to get a flu shot every fall  Consider an audiogram and hearing evaluation at [facility] or if you would prefer Heather at [facility] ear nose and throat

## 2015-10-24 ENCOUNTER — Other Ambulatory Visit: Payer: Self-pay | Admitting: Family Medicine

## 2015-10-24 DIAGNOSIS — R972 Elevated prostate specific antigen [PSA]: Secondary | ICD-10-CM

## 2016-09-03 ENCOUNTER — Emergency Department (HOSPITAL_COMMUNITY)
Admission: EM | Admit: 2016-09-03 | Discharge: 2016-09-03 | Disposition: A | Discharge status: Home / Self Care | Payer: Medicare Other | Attending: Emergency Medicine | Admitting: Emergency Medicine

## 2016-09-03 ENCOUNTER — Emergency Department (HOSPITAL_COMMUNITY): Payer: Medicare Other

## 2016-09-03 ENCOUNTER — Encounter (HOSPITAL_COMMUNITY): Payer: Self-pay

## 2016-09-03 DIAGNOSIS — Z7982 Long term (current) use of aspirin: Secondary | ICD-10-CM | POA: Diagnosis not present

## 2016-09-03 DIAGNOSIS — I10 Essential (primary) hypertension: Secondary | ICD-10-CM | POA: Diagnosis not present

## 2016-09-03 DIAGNOSIS — R002 Palpitations: Secondary | ICD-10-CM | POA: Diagnosis not present

## 2016-09-03 LAB — BASIC METABOLIC PANEL
ANION GAP: 7 (ref 5–15)
BUN: 10 mg/dL (ref 6–20)
CALCIUM: 9.4 mg/dL (ref 8.9–10.3)
CO2: 25 mmol/L (ref 22–32)
Chloride: 106 mmol/L (ref 101–111)
Creatinine, Ser: 0.81 mg/dL (ref 0.61–1.24)
GLUCOSE: 165 mg/dL — AB (ref 65–99)
POTASSIUM: 3.9 mmol/L (ref 3.5–5.1)
SODIUM: 138 mmol/L (ref 135–145)

## 2016-09-03 LAB — CBC
HEMATOCRIT: 45.1 % (ref 39.0–52.0)
HEMOGLOBIN: 15.4 g/dL (ref 13.0–17.0)
MCH: 33.6 pg (ref 26.0–34.0)
MCHC: 34.1 g/dL (ref 30.0–36.0)
MCV: 98.3 fL (ref 78.0–100.0)
Platelets: 128 10*3/uL — ABNORMAL LOW (ref 150–400)
RBC: 4.59 MIL/uL (ref 4.22–5.81)
RDW: 12.6 % (ref 11.5–15.5)
WBC: 6.4 10*3/uL (ref 4.0–10.5)

## 2016-09-03 LAB — TROPONIN I

## 2016-09-03 NOTE — ED Notes (Signed)
Placed patient into a gown and on the monitor did ekg shown to dr Eulis Foster

## 2016-09-03 NOTE — Discharge Planning (Signed)
Pt up for discharge. EDCM reviewed chart for possible CM needs.  No needs identified or communicated.  

## 2016-09-03 NOTE — ED Provider Notes (Signed)
[facility] DEPT Provider Note   CSN: [id number] Arrival date & time: 09/03/16  [id number]     History   Chief Complaint Chief Complaint  Patient presents with  . Palpitations  . Weakness    HPI Anthony Mendez is a 75 y.o. male.  He complains of a sensation of irregular heartbeat, which started last night while he was putting away decorations. This was associated with a sensation of shortness of breath. Both of these symptoms resolved after about an hour. He also noticed a period of time when he felt like his legs were wobbly and he could not walk straight. He was able to get around his home, as usual. Later in the evening last night he felt better and was able to have a glass of wine. Today he got up, ate breakfast, but noticed that his legs were feeling somewhat weak, so decided to come here. He has not had any additional sensation of palpitation, or chest pain. He denies nausea, vomiting, fever, chills, cough, focal weakness or paresthesia. He has not had similar problems previously. There are no other known modifying factors.   HPI  Past Medical History:  Diagnosis Date  . HEARING LOSS, BILATERAL 06/19/2009  . History of solar dermatitis     Patient Active Problem List   Diagnosis Date Noted  . Tinnitus 10/22/2015  . BPH associated with nocturia 10/22/2015  . Dermatitis of right ear canal 07/25/2014  . Routine general medical examination at a health care facility 04/22/2012  . Hearing loss 06/19/2009    Past Surgical History:  Procedure Laterality Date  . TONSILLECTOMY         Home Medications    Prior to Admission medications   Medication Sig Start Date End Date Taking? Authorizing Provider  aspirin 81 MG tablet Take 162 mg by mouth daily.    Yes Historical Provider, MD    Family History No family history on file.  Social History Social History  Substance Use Topics  . Smoking status: Never Smoker  . Smokeless tobacco: Not on file  . Alcohol use Not  on file     Allergies   Patient has no known allergies.   Review of Systems Review of Systems  All other systems reviewed and are negative.    Physical Exam Updated Vital Signs BP 151/94 (BP Location: Right Arm)   Pulse 74   Temp 97.7 F (36.5 C) (Oral)   Resp 17   Ht 5\' 10"  (1.778 m)   Wt 183 lb (83 kg)   SpO2 99%   BMI 26.26 kg/m   Physical Exam  Constitutional: He is oriented to person, place, and time. He appears well-developed.  Elderly, robust  HENT:  Head: Normocephalic and atraumatic.  Right Ear: External ear normal.  Left Ear: External ear normal.  Eyes: Conjunctivae and EOM are normal. Pupils are equal, round, and reactive to light.  Neck: Normal range of motion and phonation normal. Neck supple.  Cardiovascular: Normal rate, regular rhythm and normal heart sounds.   Pulmonary/Chest: Effort normal and breath sounds normal. He exhibits no bony tenderness.  Abdominal: Soft. There is no tenderness.  Musculoskeletal: Normal range of motion.  Neurological: He is alert and oriented to person, place, and time. No cranial nerve deficit or sensory deficit. He exhibits normal muscle tone. Coordination normal.  Skin: Skin is warm, dry and intact.  Psychiatric: He has a normal mood and affect. His behavior is normal. Judgment and thought content normal.  Nursing note  and vitals reviewed.    ED Treatments / Results  Labs (all labs ordered are listed, but only abnormal results are displayed) Labs Reviewed  BASIC METABOLIC PANEL - Abnormal; Notable for the following:       Result Value   Glucose, Bld 165 (*)    All other components within normal limits  CBC - Abnormal; Notable for the following:    Platelets 128 (*)    All other components within normal limits  TROPONIN I    EKG  EKG Interpretation  Date/Time:  Wednesday September 03 2016 09:30:45 EST Ventricular Rate:  80 PR Interval:    QRS Duration: 103 QT Interval:  387 QTC Calculation: 447 R  Axis:   -34 Text Interpretation:  Sinus rhythm Left axis deviation Abnormal R-wave progression, early transition Since last tracing of earlier today No significant change was found Confirmed by Eulis Foster  MD, Shantinique Picazo [phone]) on 09/03/2016 9:36:21 AM        EKG Interpretation  Date/Time:  Wednesday September 03 2016 09:30:45 EST Ventricular Rate:  80 PR Interval:    QRS Duration: 103 QT Interval:  387 QTC Calculation: 447 R Axis:   -34 Text Interpretation:  Sinus rhythm Left axis deviation Abnormal R-wave progression, early transition Since last tracing of earlier today No significant change was found Confirmed by Eulis Foster  MD, Tatiyanna Lashley [phone]) on 09/03/2016 9:36:21 AM        Radiology Dg Chest 2 View  Result Date: 09/03/2016 CLINICAL DATA:  Chest palpitations beginning yesterday. EXAM: CHEST  2 VIEW COMPARISON:  None. FINDINGS: Heart size is normal. Mediastinal shadows are normal. The lungs are clear. No bronchial thickening. No infiltrate, mass, effusion or collapse. Pulmonary vascularity is normal. No bony abnormality. IMPRESSION: Normal chest Electronically Signed   By: Nelson Chimes M.D.   On: 09/03/2016 10:18    Procedures Procedures (including critical care time)  Medications Ordered in ED Medications - No data to display   Initial Impression / Assessment and Plan / ED Course  I have reviewed the triage vital signs and the nursing notes.  Pertinent labs & imaging results that were available during my care of the patient were reviewed by me and considered in my medical decision making (see chart for details).  Clinical Course     Medications - No data to display  Patient Vitals for the past 24 hrs:  BP Temp Temp src Pulse Resp SpO2 Height Weight  09/03/16 0902 151/94 97.7 F (36.5 C) Oral 74 17 99 % 5\' 10"  (1.778 m) 183 lb (83 kg)    11:19 AM Reevaluation with update and discussion. After initial assessment and treatment, an updated evaluation reveals No change in  clinical status. Findings discussed with patient and wife, all questions were answered. Minh Roanhorse L    Final Clinical Impressions(s) / ED Diagnoses   Final diagnoses:  Palpitations  Hypertension, unspecified type    Palpitations without chest pain, or syncope. EKG is mildly abnormal. Rhythm appears to be atrial, likely sinus, but may have an ectopic focus. Patient has a benign physical exam, and recent history. No indicators for aggressive workup in the ED, or hospitalization at this time.  Nursing Notes Reviewed/ Care Coordinated Applicable Imaging Reviewed Interpretation of Laboratory Data incorporated into ED treatment  The patient appears reasonably screened and/or stabilized for discharge and I doubt any other medical condition or other [facility] requiring further screening, evaluation, or treatment in the ED at this time prior to discharge.  Plan: Home Medications- continue;  Home Treatments- rest; return here if the recommended treatment, does not improve the symptoms; Recommended follow up- PCP 1 week. Advised to f/u on abnormal EKG.   New Prescriptions New Prescriptions   No medications on file     Daleen Bo, MD 09/03/16 1120

## 2016-09-03 NOTE — Discharge Instructions (Signed)
Your tests today, were reassuring. There does not appear to be any acute problems with your heart, blood, or brain.  Your EKG was slightly abnormal and will need to be confirmed with follow-up testing and observation. Your primary care doctor may prefer sending you to a cardiologist, or possibly have you wear a heart monitor.  Make sure that you're getting plenty of rest, eating and drinking regularly, and watch for concerning symptoms.

## 2016-09-03 NOTE — ED Triage Notes (Addendum)
Pt presents for evaluation of period of irregular HR last night with generalized weakness from waist down. Pt. States last night he was taking down decorations when he noticed his HR to feel "abnormal." Pt reports no hx of afib. Pt. States has "felt off" since event. States had generalized weakness at the time and continues this AM. States feels off balance intermittently.

## 2016-10-28 ENCOUNTER — Ambulatory Visit (INDEPENDENT_AMBULATORY_CARE_PROVIDER_SITE_OTHER): Payer: Medicare Other | Admitting: Family Medicine

## 2016-10-28 ENCOUNTER — Encounter: Payer: Self-pay | Admitting: Family Medicine

## 2016-10-28 VITALS — BP 130/80 | HR 60 | Temp 97.9°F | Ht 70.0 in | Wt 192.4 lb

## 2016-10-28 DIAGNOSIS — H833X3 Noise effects on inner ear, bilateral: Secondary | ICD-10-CM | POA: Diagnosis not present

## 2016-10-28 DIAGNOSIS — N401 Enlarged prostate with lower urinary tract symptoms: Secondary | ICD-10-CM

## 2016-10-28 DIAGNOSIS — R351 Nocturia: Secondary | ICD-10-CM

## 2016-10-28 DIAGNOSIS — I499 Cardiac arrhythmia, unspecified: Secondary | ICD-10-CM | POA: Diagnosis not present

## 2016-10-28 DIAGNOSIS — H9313 Tinnitus, bilateral: Secondary | ICD-10-CM | POA: Diagnosis not present

## 2016-10-28 DIAGNOSIS — Z0000 Encounter for general adult medical examination without abnormal findings: Secondary | ICD-10-CM | POA: Diagnosis not present

## 2016-10-28 LAB — HEPATIC FUNCTION PANEL
ALK PHOS: 40 U/L (ref 39–117)
ALT: 19 U/L (ref 0–53)
AST: 19 U/L (ref 0–37)
Albumin: 4.5 g/dL (ref 3.5–5.2)
BILIRUBIN DIRECT: 0.2 mg/dL (ref 0.0–0.3)
Total Bilirubin: 1 mg/dL (ref 0.2–1.2)
Total Protein: 6.8 g/dL (ref 6.0–8.3)

## 2016-10-28 LAB — POCT URINALYSIS DIPSTICK
Bilirubin, UA: NEGATIVE
Blood, UA: NEGATIVE
GLUCOSE UA: NEGATIVE
Ketones, UA: NEGATIVE
Leukocytes, UA: NEGATIVE
NITRITE UA: NEGATIVE
Protein, UA: NEGATIVE
Spec Grav, UA: >=1.03
Urobilinogen, UA: 0.2
pH, UA: 5.5

## 2016-10-28 LAB — PSA: PSA: 3.39 ng/mL (ref 0.10–4.00)

## 2016-10-28 LAB — LIPID PANEL
CHOL/HDL RATIO: 5
Cholesterol: 228 mg/dL — ABNORMAL HIGH (ref 0–200)
HDL: 44.8 mg/dL (ref >39.00)
LDL Cholesterol: 156 mg/dL — ABNORMAL HIGH (ref 0–99)
NONHDL: 183.58
TRIGLYCERIDES: 138 mg/dL (ref 0.0–149.0)
VLDL: 27.6 mg/dL (ref 0.0–40.0)

## 2016-10-28 LAB — BASIC METABOLIC PANEL
BUN: 18 mg/dL (ref 6–23)
CHLORIDE: 104 meq/L (ref 96–112)
CO2: 30 meq/L (ref 19–32)
Calcium: 9.5 mg/dL (ref 8.4–10.5)
Creatinine, Ser: 0.9 mg/dL (ref 0.40–1.50)
GFR: 87.17 mL/min (ref >60.00)
Glucose, Bld: 86 mg/dL (ref 70–99)
Potassium: 4.2 mEq/L (ref 3.5–5.1)
SODIUM: 139 meq/L (ref 135–145)

## 2016-10-28 LAB — TSH: TSH: 2.8 u[IU]/mL (ref 0.35–4.50)

## 2016-10-28 NOTE — Progress Notes (Signed)
Anthony Mendez is a 76 year old married male nonsmoker who comes in today for annual physical examination  He's always been in excellent health he said no chronic health problems. He takes one aspirin tablet daily  He does have tinnitus and decreased hearing. He's been encouraged to go to [facility] for hearing evaluation. He's been there in the past but it's been many years ago.  He has some mild BPH with nocturia otherwise urologically he's asymptomatic.  He gets routine eye care, dental care, never had a colonoscopy. Been recommended in the past but is always declined. He agrees to go and talk to them this year.  Vaccinations tetanus booster 2013 Clines a flu shot. Instructed how to get a shingles vaccine.  Cognitive function normally exercises daily home health safety reviewed no issues identified, no guns in the house, he does have a healthcare power of attorney and living well  Social history married lives here in [place] still works full-time and is a Insurance underwriter.   BP 130/80 (BP Location: Right Arm, Patient Position: Sitting, Cuff Size: Normal)   Pulse 60   Temp 97.9 F (36.6 C) (Oral)   Ht 5\' 10"  ([id number] m)   Wt 192 lb 6.4 oz (87.3 kg)   BMI 27.61 kg/m  In general he's a well-developed well-nourished male no acute distress. He has obvious hearing loss.  Examination of the HEENT were negative neck was supple thyroid not enlarged no carotid bruits. Lungs are clear to auscultation cardiac exam shows no murmur however there is a skip. EKG will be done for confirmation and evaluation.  Abdominal exam normal genitalia normal circumcised male rectum normal stool guaiac-negative prostate smooth nonnodular 2+ BPH......... nocturia 1...Marland KitchenMarland KitchenMarland Kitchen extremities normal skin no peripheral pulses normal except for rash in the right side of his neck which appears to be a contact type dermatitis  #1 healthy male  #2 irregular heart rate............Marland Kitchen

## 2016-10-28 NOTE — Patient Instructions (Signed)
I would strongly recommend you call [facility] ear nose and throat and make an appointment to see Dr. Tonia Ghent for hearing evaluation  We will also get you set up for cardiac evaluation because of irregular heart rate.  Continue your good health habits and daily exercise  Follow-up in one year sooner if any problem  Also recommend you get a colonoscopy

## 2016-10-28 NOTE — Progress Notes (Signed)
Pre visit review using our clinic review tool, if applicable. No additional management support is needed unless otherwise documented below in the visit note. 

## 2016-11-11 ENCOUNTER — Ambulatory Visit (INDEPENDENT_AMBULATORY_CARE_PROVIDER_SITE_OTHER): Payer: Medicare Other | Admitting: Cardiovascular Disease

## 2016-11-11 ENCOUNTER — Encounter: Payer: Self-pay | Admitting: Cardiovascular Disease

## 2016-11-11 VITALS — BP 140/83 | HR 71 | Ht 70.0 in | Wt 193.4 lb

## 2016-11-11 DIAGNOSIS — I491 Atrial premature depolarization: Secondary | ICD-10-CM

## 2016-11-11 DIAGNOSIS — I10 Essential (primary) hypertension: Secondary | ICD-10-CM | POA: Diagnosis not present

## 2016-11-11 DIAGNOSIS — E7800 Pure hypercholesterolemia, unspecified: Secondary | ICD-10-CM

## 2016-11-11 DIAGNOSIS — E785 Hyperlipidemia, unspecified: Secondary | ICD-10-CM | POA: Insufficient documentation

## 2016-11-11 HISTORY — DX: Hyperlipidemia, unspecified: E78.5

## 2016-11-11 HISTORY — DX: Essential (primary) hypertension: I10

## 2016-11-11 NOTE — Patient Instructions (Signed)
Medication Instructions:  .Your physician recommends that you continue on your current medications as directed. Please refer to the Current Medication list given to you today.  Labwork: none  Testing/Procedures: none  Follow-Up: As needed   

## 2016-11-11 NOTE — Progress Notes (Signed)
Cardiology Office Note   Date:  11/11/2016   ID:  Anthony Mendez, DOB 07-Aug-1941, MRN [id number]  PCP:  Joycelyn Man, MD  Cardiologist:   Skeet Latch, MD   Chief Complaint  Patient presents with  . New Patient (Initial Visit)    Pt states no Sx.       History of Present Illness: Anthony Mendez is a 76 y.o. male who presents for evaluation of an irregular heart rate.  [name] saw [name] and was noted to have an irregular heart rate on exam.  EKG showed that the first beat was a PAC and it was otherwise sinus rhythm.  He was referred to cardiology for further evaluation.  He was also seen in the ED 08/2016 with palpitations and weakness.  This occurred while putting up Christmas decorations and was associated with shortness of breath.  No arrhythmias were noted at that time.  [name] has been feeling well and is without complaint.  He denies chest Pain, shortness of breath, lower extremity edema, orthopnea, or PND. He does not feel any palpitations also has not noted any lightheadedness or dizziness recently.    [name] does not exercise regularly.  He rides his bike at times but hasn't been doing this lately.  He enjoys golfing and denies chest pain or shortness of breath with exertion. He has not noted any lightheadedness or dizziness upon standing. He is otherwise without complaint at this time.  He drinks one to 2 glasses of alcohol nightly. He drinks no more than one cup of coffee per day.    Past Medical History:  Diagnosis Date  . Essential hypertension 11/11/2016  . HEARING LOSS, BILATERAL 06/19/2009  . History of solar dermatitis   . Hyperlipidemia 11/11/2016    Past Surgical History:  Procedure Laterality Date  . TONSILLECTOMY       Current Outpatient Prescriptions  Medication Sig Dispense Refill  . aspirin 81 MG tablet Take 162 mg by mouth daily.      No current facility-administered medications for this visit.     Allergies:   Patient  has no known allergies.    Social History:  The patient  reports that he has never smoked. He has never used smokeless tobacco. He reports that he drinks about 0.6 oz of alcohol per week . He reports that he does not use drugs.   Family History:  The patient's family history includes Hypertension in his mother; Stroke in his mother.    ROS:  Please see the history of present illness.   Otherwise, review of systems are positive for none.   All other systems are reviewed and negative.    PHYSICAL EXAM: VS:  BP 140/83   Pulse 71   Ht 5\' 10"  (1.778 m)   Wt 87.7 kg (193 lb 6.4 oz)   BMI 27.75 kg/m  , BMI Body mass index is 27.75 kg/m. GENERAL:  Well appearing HEENT:  Pupils equal round and reactive, fundi not visualized, oral mucosa unremarkable NECK:  No jugular venous distention, waveform within normal limits, carotid upstroke brisk and symmetric, no bruits, no thyromegaly LYMPHATICS:  No cervical adenopathy LUNGS:  Clear to auscultation bilaterally HEART:  RRR.  PMI not displaced or sustained,S1 and S2 within normal limits, no S3, no S4, no clicks, no rubs, I/VI holosystolic murmur at the LLSB and apex ABD:  Flat, positive bowel sounds normal in frequency in pitch, no bruits, no rebound, no guarding, no  midline pulsatile mass, no hepatomegaly, no splenomegaly EXT:  2 plus pulses throughout, no edema, no cyanosis no clubbing SKIN:  No rashes no nodules NEURO:  Cranial nerves II through XII grossly intact, motor grossly intact throughout PSYCH:  Cognitively intact, oriented to person place and time    EKG:  EKG is ordered today. 10/28/16: One PAC.  Sinus rhythm rate 60 bpm.   Recent Labs: 09/03/2016: Hemoglobin 15.4; Platelets 128 10/28/2016: ALT 19; BUN 18; Creatinine, Ser 0.90; Potassium 4.2; Sodium 139; TSH 2.80    Lipid Panel    Component Value Date/Time   CHOL 228 (H) 10/28/2016 1158   TRIG 138.0 10/28/2016 1158   HDL 44.80 10/28/2016 1158   CHOLHDL 5 10/28/2016 1158    VLDL 27.6 10/28/2016 1158   LDLCALC 156 (H) 10/28/2016 1158   LDLDIRECT 145.9 07/25/2014 1029      Wt Readings from Last 3 Encounters:  11/11/16 87.7 kg (193 lb 6.4 oz)  10/28/16 87.3 kg (192 lb 6.4 oz)  09/03/16 83 kg (183 lb)      ASSESSMENT AND PLAN:  # Hypertension: Blood pressure is elevated with stage I hypertension.  He would like to work on diet and exercise.  Goal <130/80.  # Hyperlipidemia: ASCVD 10 year risk is 32%.  He has been eating a lot of pimento cheese and eating beef.  We discussed dietary changes and recommended that he increase his exercise to at least 90-150 minutes weekly.  Recommend repeating lipids in 6 months and starting statin if 10 year risk is >10%.  # PACs: Mr. Rensch was noted to have a PAC on EKG and had occasional early beats on exam today.  TSH and electrolytes within normal limits.  No additional work up at this time.  He will call if he develops symptoms.    Current medicines are reviewed at length with the patient today.  The patient does not have concerns regarding medicines.  The following changes have been made:  no change  Labs/ tests ordered today include:  No orders of the defined types were placed in this encounter.    Disposition:   FU with Karol Liendo C. Oval Linsey, MD, [facility] as needed    This note was written with the assistance of speech recognition software.  Please excuse any transcriptional errors.  Signed, Josimar Corning C. Oval Linsey, MD, [facility]  11/11/2016 9:20 AM    [place]

## 2016-11-25 ENCOUNTER — Encounter: Payer: Self-pay | Admitting: Family Medicine

## 2017-05-01 DIAGNOSIS — M238X1 Other internal derangements of right knee: Secondary | ICD-10-CM | POA: Diagnosis not present

## 2017-05-01 DIAGNOSIS — M25561 Pain in right knee: Secondary | ICD-10-CM | POA: Diagnosis not present

## 2017-05-01 DIAGNOSIS — M94261 Chondromalacia, right knee: Secondary | ICD-10-CM | POA: Diagnosis not present

## 2017-05-28 DIAGNOSIS — H903 Sensorineural hearing loss, bilateral: Secondary | ICD-10-CM | POA: Diagnosis not present

## 2017-05-28 DIAGNOSIS — H6692 Otitis media, unspecified, left ear: Secondary | ICD-10-CM | POA: Diagnosis not present

## 2017-05-28 DIAGNOSIS — H6522 Chronic serous otitis media, left ear: Secondary | ICD-10-CM | POA: Diagnosis not present

## 2017-05-28 DIAGNOSIS — H6122 Impacted cerumen, left ear: Secondary | ICD-10-CM | POA: Diagnosis not present

## 2017-05-28 DIAGNOSIS — H6062 Unspecified chronic otitis externa, left ear: Secondary | ICD-10-CM | POA: Diagnosis not present

## 2017-06-04 DIAGNOSIS — H903 Sensorineural hearing loss, bilateral: Secondary | ICD-10-CM | POA: Diagnosis not present

## 2017-06-04 DIAGNOSIS — H6062 Unspecified chronic otitis externa, left ear: Secondary | ICD-10-CM | POA: Diagnosis not present

## 2017-06-04 DIAGNOSIS — H6522 Chronic serous otitis media, left ear: Secondary | ICD-10-CM | POA: Diagnosis not present

## 2017-06-04 DIAGNOSIS — H9313 Tinnitus, bilateral: Secondary | ICD-10-CM | POA: Diagnosis not present

## 2017-06-04 DIAGNOSIS — H6122 Impacted cerumen, left ear: Secondary | ICD-10-CM | POA: Diagnosis not present

## 2017-06-29 DIAGNOSIS — H6522 Chronic serous otitis media, left ear: Secondary | ICD-10-CM | POA: Diagnosis not present

## 2017-07-20 DIAGNOSIS — H7292 Unspecified perforation of tympanic membrane, left ear: Secondary | ICD-10-CM | POA: Diagnosis not present

## 2017-07-20 DIAGNOSIS — H6063 Unspecified chronic otitis externa, bilateral: Secondary | ICD-10-CM | POA: Diagnosis not present

## 2017-08-14 ENCOUNTER — Other Ambulatory Visit (HOSPITAL_COMMUNITY): Payer: Self-pay | Admitting: Otolaryngology

## 2017-08-14 DIAGNOSIS — IMO0001 Reserved for inherently not codable concepts without codable children: Secondary | ICD-10-CM

## 2017-08-14 DIAGNOSIS — H9042 Sensorineural hearing loss, unilateral, left ear, with unrestricted hearing on the contralateral side: Secondary | ICD-10-CM

## 2017-08-14 DIAGNOSIS — H9312 Tinnitus, left ear: Secondary | ICD-10-CM

## 2017-08-14 DIAGNOSIS — H903 Sensorineural hearing loss, bilateral: Secondary | ICD-10-CM | POA: Diagnosis not present

## 2017-08-26 ENCOUNTER — Encounter (INDEPENDENT_AMBULATORY_CARE_PROVIDER_SITE_OTHER): Payer: Self-pay

## 2017-08-26 ENCOUNTER — Encounter (HOSPITAL_COMMUNITY): Payer: Self-pay

## 2017-08-26 ENCOUNTER — Ambulatory Visit (HOSPITAL_COMMUNITY)
Admission: RE | Admit: 2017-08-26 | Discharge: 2017-08-26 | Disposition: A | Discharge status: Home / Self Care | Payer: Medicare Other | Source: Ambulatory Visit | Attending: Otolaryngology | Admitting: Otolaryngology

## 2017-08-26 DIAGNOSIS — J329 Chronic sinusitis, unspecified: Secondary | ICD-10-CM | POA: Diagnosis not present

## 2017-08-26 DIAGNOSIS — J322 Chronic ethmoidal sinusitis: Secondary | ICD-10-CM | POA: Diagnosis not present

## 2017-08-26 DIAGNOSIS — IMO0001 Reserved for inherently not codable concepts without codable children: Secondary | ICD-10-CM

## 2017-08-26 DIAGNOSIS — H9312 Tinnitus, left ear: Secondary | ICD-10-CM | POA: Insufficient documentation

## 2017-08-26 DIAGNOSIS — H9042 Sensorineural hearing loss, unilateral, left ear, with unrestricted hearing on the contralateral side: Secondary | ICD-10-CM | POA: Insufficient documentation

## 2017-08-27 DIAGNOSIS — H6062 Unspecified chronic otitis externa, left ear: Secondary | ICD-10-CM | POA: Diagnosis not present

## 2017-08-27 DIAGNOSIS — H7292 Unspecified perforation of tympanic membrane, left ear: Secondary | ICD-10-CM | POA: Diagnosis not present

## 2017-09-25 ENCOUNTER — Ambulatory Visit: Payer: Self-pay | Admitting: Neurology

## 2017-10-20 DIAGNOSIS — H7202 Central perforation of tympanic membrane, left ear: Secondary | ICD-10-CM | POA: Diagnosis not present

## 2017-10-20 DIAGNOSIS — H9122 Sudden idiopathic hearing loss, left ear: Secondary | ICD-10-CM | POA: Diagnosis not present

## 2017-10-20 DIAGNOSIS — H906 Mixed conductive and sensorineural hearing loss, bilateral: Secondary | ICD-10-CM | POA: Diagnosis not present

## 2017-10-28 DIAGNOSIS — H9122 Sudden idiopathic hearing loss, left ear: Secondary | ICD-10-CM | POA: Diagnosis not present

## 2017-10-28 DIAGNOSIS — H7202 Central perforation of tympanic membrane, left ear: Secondary | ICD-10-CM | POA: Diagnosis not present

## 2017-10-28 DIAGNOSIS — H7012 Chronic mastoiditis, left ear: Secondary | ICD-10-CM | POA: Diagnosis not present

## 2017-10-28 DIAGNOSIS — H906 Mixed conductive and sensorineural hearing loss, bilateral: Secondary | ICD-10-CM | POA: Diagnosis not present

## 2017-11-09 ENCOUNTER — Ambulatory Visit (INDEPENDENT_AMBULATORY_CARE_PROVIDER_SITE_OTHER)
Admission: RE | Admit: 2017-11-09 | Discharge: 2017-11-09 | Disposition: A | Discharge status: Home / Self Care | Payer: Medicare Other | Source: Ambulatory Visit | Attending: Family Medicine | Admitting: Family Medicine

## 2017-11-09 ENCOUNTER — Ambulatory Visit (INDEPENDENT_AMBULATORY_CARE_PROVIDER_SITE_OTHER): Payer: Medicare Other | Admitting: Family Medicine

## 2017-11-09 ENCOUNTER — Encounter: Payer: Self-pay | Admitting: Family Medicine

## 2017-11-09 VITALS — BP 138/88 | HR 74 | Temp 97.8°F | Ht 70.0 in | Wt 191.0 lb

## 2017-11-09 DIAGNOSIS — I10 Essential (primary) hypertension: Secondary | ICD-10-CM

## 2017-11-09 DIAGNOSIS — R351 Nocturia: Secondary | ICD-10-CM | POA: Diagnosis not present

## 2017-11-09 DIAGNOSIS — N401 Enlarged prostate with lower urinary tract symptoms: Secondary | ICD-10-CM

## 2017-11-09 DIAGNOSIS — Z01818 Encounter for other preprocedural examination: Secondary | ICD-10-CM | POA: Diagnosis not present

## 2017-11-09 DIAGNOSIS — Z0000 Encounter for general adult medical examination without abnormal findings: Secondary | ICD-10-CM

## 2017-11-09 DIAGNOSIS — H9122 Sudden idiopathic hearing loss, left ear: Secondary | ICD-10-CM

## 2017-11-09 LAB — TSH: TSH: 2.19 u[IU]/mL (ref 0.35–4.50)

## 2017-11-09 LAB — POCT URINALYSIS DIPSTICK
Bilirubin, UA: NEGATIVE
Blood, UA: NEGATIVE
GLUCOSE UA: NEGATIVE
Ketones, UA: NEGATIVE
LEUKOCYTES UA: NEGATIVE
Nitrite, UA: NEGATIVE
ODOR: NEGATIVE
Spec Grav, UA: 1.025 (ref 1.010–1.025)
Urobilinogen, UA: 0.2 E.U./dL
pH, UA: 6 (ref 5.0–8.0)

## 2017-11-09 LAB — CBC WITH DIFFERENTIAL/PLATELET
Basophils Absolute: 0 10*3/uL (ref 0.0–0.1)
Basophils Relative: 0.7 % (ref 0.0–3.0)
EOS PCT: 3.5 % (ref 0.0–5.0)
Eosinophils Absolute: 0.2 10*3/uL (ref 0.0–0.7)
HCT: 42.4 % (ref 39.0–52.0)
HEMOGLOBIN: 14.6 g/dL (ref 13.0–17.0)
Lymphocytes Relative: 27.8 % (ref 12.0–46.0)
Lymphs Abs: 1.3 10*3/uL (ref 0.7–4.0)
MCHC: 34.5 g/dL (ref 30.0–36.0)
MCV: 100.6 fl — AB (ref 78.0–100.0)
MONOS PCT: 9 % (ref 3.0–12.0)
Monocytes Absolute: 0.4 10*3/uL (ref 0.1–1.0)
Neutro Abs: 2.7 10*3/uL (ref 1.4–7.7)
Neutrophils Relative %: 59 % (ref 43.0–77.0)
Platelets: 117 10*3/uL — ABNORMAL LOW (ref 150.0–400.0)
RBC: 4.21 Mil/uL — ABNORMAL LOW (ref 4.22–5.81)
RDW: 13 % (ref 11.5–15.5)
WBC: 4.5 10*3/uL (ref 4.0–10.5)

## 2017-11-09 LAB — HEPATIC FUNCTION PANEL
ALT: 22 U/L (ref 0–53)
AST: 17 U/L (ref 0–37)
Albumin: 3.7 g/dL (ref 3.5–5.2)
Alkaline Phosphatase: 40 U/L (ref 39–117)
BILIRUBIN DIRECT: 0.2 mg/dL (ref 0.0–0.3)
BILIRUBIN TOTAL: 0.9 mg/dL (ref 0.2–1.2)
Total Protein: 6.2 g/dL (ref 6.0–8.3)

## 2017-11-09 LAB — LIPID PANEL
Cholesterol: 225 mg/dL — ABNORMAL HIGH (ref 0–200)
HDL: 56.2 mg/dL (ref >39.00)
LDL CALC: 144 mg/dL — AB (ref 0–99)
NONHDL: 168.85
Total CHOL/HDL Ratio: 4
Triglycerides: 122 mg/dL (ref 0.0–149.0)
VLDL: 24.4 mg/dL (ref 0.0–40.0)

## 2017-11-09 LAB — BASIC METABOLIC PANEL
BUN: 15 mg/dL (ref 6–23)
CALCIUM: 9.3 mg/dL (ref 8.4–10.5)
CO2: 30 meq/L (ref 19–32)
Chloride: 106 mEq/L (ref 96–112)
Creatinine, Ser: 0.89 mg/dL (ref 0.40–1.50)
GFR: 88.06 mL/min (ref >60.00)
Glucose, Bld: 92 mg/dL (ref 70–99)
Potassium: 4.7 mEq/L (ref 3.5–5.1)
SODIUM: 140 meq/L (ref 135–145)

## 2017-11-09 LAB — PROTIME-INR
INR: 1 ratio (ref 0.8–1.0)
PROTHROMBIN TIME: 10.8 s (ref 9.6–13.1)

## 2017-11-09 LAB — APTT: APTT: 29.7 s (ref 23.4–32.7)

## 2017-11-09 LAB — PSA: PSA: 6.34 ng/mL — AB (ref 0.10–4.00)

## 2017-11-09 NOTE — Progress Notes (Signed)
Anthony Mendez is a 77 year old married male nonsmoker who comes in today for annual physical examination  This past January he had flown to [place] for a meeting. During the meeting noticed a sudden loss of hearing in his left ear. He had a ruptured TM. He's due to have it patched by Dr. Elwyn Reach in the next couple weeks.  He's had a long-standing history of hearing loss. I've recommended for years that he get hearing aids. Now he's going to pursue that.  He gets routine eye care, dental care, is always declined a colonoscopy. Discuss the new: Guard. He's agreed to talk with the GI folks  Vaccinations up-to-date information given on the new shingles vaccine.  He takes 1 aspirin tablet daily otherwise no other medication.  Cognitive function normal he walks daily home health safety reviewed no issues identified, no guns in the house, he does have a healthcare power of attorney and living well.  Social history.........Marland Kitchen married lives here in [place] still works full-time runs a Building control surveyor.  BP 138/88 (BP Location: Left Arm, Patient Position: Sitting, Cuff Size: Normal)   Pulse 74   Temp 97.8 F (36.6 C) (Oral)   Ht 5\' 10"  (1.778 m)   Wt 191 lb (86.6 kg)   SpO2 98%   BMI 27.41 kg/m  Well-developed well-nourished male no acute distress vital signs stable is afebrile HEENT were negative except for some juvenile cataracts, and a perforation left TM 12 to 3:00.   Neck was supple thyroid not enlarged no carotid bruits. Cardiopulmonary exam normal abdominal exam normal. Genitalia normal circumcised male rectal normal stool guaiac negative prostate smooth 1+ nonnodular BPH......... admits to nocturia 1... Extremities normal skin normal peripheral pulses normal.  #1 healthy male  #2 perforation left TM............ physical exam and labs preop per Dr. Elwyn Reach done. Patient approved for surgery information sent to Dr. Elwyn Reach  #3 chronic long-term hearing loss......... again recommended hearing  aids

## 2017-11-09 NOTE — Patient Instructions (Signed)
Labs today,,,,,,,, I will call you if is anything abnormal  Cut the main office on [facility],,,,,,,,,,, for your chest x-ray today  I put in a request for a consult from GI,,,,,,,, to discuss different forms of screening for colon cancer  Call your insurance company to find a ring get the shingles vaccine........Marland Kitchen remember to Tylenol 2 hours prior to the first vaccination then 2 tablets every 6 hours for the first 2 days  Return in one year for general physical exam sooner if any problems

## 2017-11-10 ENCOUNTER — Other Ambulatory Visit: Payer: Self-pay | Admitting: Family Medicine

## 2017-11-10 DIAGNOSIS — R972 Elevated prostate specific antigen [PSA]: Secondary | ICD-10-CM

## 2017-11-16 ENCOUNTER — Other Ambulatory Visit: Payer: Self-pay | Admitting: Otolaryngology

## 2017-11-16 DIAGNOSIS — H9122 Sudden idiopathic hearing loss, left ear: Secondary | ICD-10-CM | POA: Diagnosis not present

## 2017-11-16 DIAGNOSIS — H7012 Chronic mastoiditis, left ear: Secondary | ICD-10-CM | POA: Diagnosis not present

## 2017-11-16 DIAGNOSIS — H7202 Central perforation of tympanic membrane, left ear: Secondary | ICD-10-CM | POA: Diagnosis not present

## 2017-11-16 DIAGNOSIS — H906 Mixed conductive and sensorineural hearing loss, bilateral: Secondary | ICD-10-CM | POA: Diagnosis not present

## 2017-12-30 DIAGNOSIS — H906 Mixed conductive and sensorineural hearing loss, bilateral: Secondary | ICD-10-CM | POA: Diagnosis not present

## 2017-12-30 DIAGNOSIS — H73892 Other specified disorders of tympanic membrane, left ear: Secondary | ICD-10-CM | POA: Diagnosis not present

## 2017-12-30 DIAGNOSIS — M26622 Arthralgia of left temporomandibular joint: Secondary | ICD-10-CM | POA: Diagnosis not present

## 2018-01-11 ENCOUNTER — Encounter: Payer: Self-pay | Admitting: Family Medicine

## 2018-01-19 ENCOUNTER — Other Ambulatory Visit: Payer: Medicare Other

## 2018-01-19 ENCOUNTER — Other Ambulatory Visit (INDEPENDENT_AMBULATORY_CARE_PROVIDER_SITE_OTHER): Payer: Medicare Other

## 2018-01-19 DIAGNOSIS — R972 Elevated prostate specific antigen [PSA]: Secondary | ICD-10-CM

## 2018-01-20 LAB — PSA, TOTAL AND FREE
PSA, % Free: 23 % (calc) — ABNORMAL LOW (ref >25)
PSA, FREE: 0.9 ng/mL
PSA, TOTAL: 4 ng/mL (ref <=4.0)

## 2018-01-21 DIAGNOSIS — M26622 Arthralgia of left temporomandibular joint: Secondary | ICD-10-CM | POA: Diagnosis not present

## 2018-01-21 DIAGNOSIS — H73892 Other specified disorders of tympanic membrane, left ear: Secondary | ICD-10-CM | POA: Diagnosis not present

## 2018-01-21 DIAGNOSIS — H906 Mixed conductive and sensorineural hearing loss, bilateral: Secondary | ICD-10-CM | POA: Diagnosis not present

## 2018-01-22 ENCOUNTER — Telehealth: Payer: Self-pay | Admitting: Family Medicine

## 2018-01-22 NOTE — Telephone Encounter (Signed)
Copied from [place] [id number]. Topic: Quick Communication - Lab Results >> Jan 21, 2018  4:40 PM [place], Andee Poles, CMA wrote: Called patient to inform them of  lab results. When patient returns call, triage nurse may disclose results.

## 2018-01-22 NOTE — Telephone Encounter (Signed)
See result notes. 

## 2018-03-04 DIAGNOSIS — H6522 Chronic serous otitis media, left ear: Secondary | ICD-10-CM | POA: Diagnosis not present

## 2018-03-04 DIAGNOSIS — H7202 Central perforation of tympanic membrane, left ear: Secondary | ICD-10-CM | POA: Diagnosis not present

## 2018-03-04 DIAGNOSIS — H906 Mixed conductive and sensorineural hearing loss, bilateral: Secondary | ICD-10-CM | POA: Diagnosis not present

## 2018-03-04 DIAGNOSIS — H73892 Other specified disorders of tympanic membrane, left ear: Secondary | ICD-10-CM | POA: Diagnosis not present

## 2018-03-22 ENCOUNTER — Ambulatory Visit (INDEPENDENT_AMBULATORY_CARE_PROVIDER_SITE_OTHER): Payer: Medicare Other | Admitting: Family Medicine

## 2018-03-22 ENCOUNTER — Encounter: Payer: Self-pay | Admitting: Family Medicine

## 2018-03-22 VITALS — BP 122/74 | HR 84 | Temp 97.8°F | Wt 192.0 lb

## 2018-03-22 DIAGNOSIS — R030 Elevated blood-pressure reading, without diagnosis of hypertension: Secondary | ICD-10-CM | POA: Diagnosis not present

## 2018-03-22 NOTE — Patient Instructions (Signed)
Monitor blood pressure and be in touch if consistently > 140/90.  Try to establish more consistent exercise  Keep daily wine consumption < 12 ounce.

## 2018-03-22 NOTE — Progress Notes (Signed)
  Subjective:     Patient ID: Anthony Mendez, male   DOB: [birth year], 77 y.o.   MRN: [id number]  HPI Patient seen with concerns for recent elevated blood pressure. He had major ear surgery with eardrum reconstruction back in March. He thinks a lot of his blood pressure elevation is related to anxiety pertaining to that. He has never been treated for hypertension. Takes no medication other than aspirin.  He has recently been taking some over-the-counter nonsteroidals. He drinks usually about 1-2 glasses of wine per day. No consistent exercise. No recent headaches. No chest pains. No dizziness.  Brings in a log of home blood pressure readings and high of 165 but most of his recent readings have been 165B systolic and mostly 90X diastolic.  Past Medical History:  Diagnosis Date  . Essential hypertension 11/11/2016  . HEARING LOSS, BILATERAL 06/19/2009  . History of solar dermatitis   . Hyperlipidemia 11/11/2016   Past Surgical History:  Procedure Laterality Date  . TONSILLECTOMY      reports that he has never smoked. He has never used smokeless tobacco. He reports that he drinks about 0.6 oz of alcohol per week. He reports that he does not use drugs. family history includes Hypertension in his mother; Stroke in his mother. No Known Allergies   Review of Systems  Constitutional: Negative for fatigue.  Eyes: Negative for visual disturbance.  Respiratory: Negative for cough, chest tightness and shortness of breath.   Cardiovascular: Negative for chest pain, palpitations and leg swelling.  Neurological: Negative for dizziness, syncope, weakness, light-headedness and headaches.       Objective:   Physical Exam  Constitutional: He is oriented to person, place, and time. He appears well-developed and well-nourished.  HENT:  Right Ear: External ear normal.  Left Ear: External ear normal.  Mouth/Throat: Oropharynx is clear and moist.  Eyes: Pupils are equal, round, and reactive to light.   Neck: Neck supple. No thyromegaly present.  Cardiovascular: Normal rate and regular rhythm.  Pulmonary/Chest: Effort normal and breath sounds normal. No respiratory distress. He has no wheezes. He has no rales.  Musculoskeletal: He exhibits no edema.  Neurological: He is alert and oriented to person, place, and time.       Assessment:     Recent elevated blood pressure without diagnosis of hypertension. Repeat today left arm seated after rest 122/74    Plan:     -We have recommended observation for now and be in touch if consistent blood pressure readings greater than 140/90 -Establish more consistent aerobic exercise -Keep wine consumption no more than 12 ounces per day -Avoid regular use of nonsteroidals which can raise blood pressure -Consider follow-up with primary in 3 months to reassess  [name] MD [facility] at [facility]

## 2018-04-29 DIAGNOSIS — H7202 Central perforation of tympanic membrane, left ear: Secondary | ICD-10-CM | POA: Diagnosis not present

## 2018-04-29 DIAGNOSIS — H906 Mixed conductive and sensorineural hearing loss, bilateral: Secondary | ICD-10-CM | POA: Diagnosis not present

## 2018-04-29 DIAGNOSIS — H6522 Chronic serous otitis media, left ear: Secondary | ICD-10-CM | POA: Diagnosis not present

## 2018-04-29 DIAGNOSIS — H73892 Other specified disorders of tympanic membrane, left ear: Secondary | ICD-10-CM | POA: Diagnosis not present

## 2018-07-29 DIAGNOSIS — H35372 Puckering of macula, left eye: Secondary | ICD-10-CM | POA: Diagnosis not present

## 2018-07-29 DIAGNOSIS — H43391 Other vitreous opacities, right eye: Secondary | ICD-10-CM | POA: Diagnosis not present

## 2018-07-29 DIAGNOSIS — H43811 Vitreous degeneration, right eye: Secondary | ICD-10-CM | POA: Diagnosis not present

## 2018-07-29 DIAGNOSIS — H35371 Puckering of macula, right eye: Secondary | ICD-10-CM | POA: Diagnosis not present

## 2018-09-20 DIAGNOSIS — H906 Mixed conductive and sensorineural hearing loss, bilateral: Secondary | ICD-10-CM | POA: Diagnosis not present

## 2018-09-20 DIAGNOSIS — H7202 Central perforation of tympanic membrane, left ear: Secondary | ICD-10-CM | POA: Diagnosis not present

## 2018-09-21 DIAGNOSIS — H35371 Puckering of macula, right eye: Secondary | ICD-10-CM | POA: Diagnosis not present

## 2018-09-21 DIAGNOSIS — H43391 Other vitreous opacities, right eye: Secondary | ICD-10-CM | POA: Diagnosis not present

## 2018-09-21 DIAGNOSIS — H35372 Puckering of macula, left eye: Secondary | ICD-10-CM | POA: Diagnosis not present

## 2018-09-21 DIAGNOSIS — H43811 Vitreous degeneration, right eye: Secondary | ICD-10-CM | POA: Diagnosis not present

## 2019-02-22 ENCOUNTER — Telehealth: Payer: Self-pay

## 2019-02-22 NOTE — Telephone Encounter (Signed)
Please see message. Can someone set this patient up for New Patient for Dr. [name]? Thank you!

## 2019-02-22 NOTE — Telephone Encounter (Signed)
Please advise.  Copied from [place] [id number]. Topic: Appointment Scheduling - New Patient >> Feb 21, 2019  2:32 PM Alanda Slim E wrote: New patient would like approval to be scheduled for your office. Provider: Dr. [name] or Dr. Sarajane Jews Pt has seen Dr. [name] in the past   Route to department's PEC pool.

## 2019-02-22 NOTE — Telephone Encounter (Signed)
OK to set up 

## 2019-03-01 DIAGNOSIS — H722X2 Other marginal perforations of tympanic membrane, left ear: Secondary | ICD-10-CM | POA: Diagnosis not present

## 2019-03-01 DIAGNOSIS — H6123 Impacted cerumen, bilateral: Secondary | ICD-10-CM | POA: Diagnosis not present

## 2019-03-01 NOTE — Telephone Encounter (Signed)
LMVM for the patient to contact the office to schedule a TOC appointment with Dr. [name]. Dr. [name] okayed the TOC.

## 2019-03-22 ENCOUNTER — Ambulatory Visit (INDEPENDENT_AMBULATORY_CARE_PROVIDER_SITE_OTHER): Payer: Medicare Other | Admitting: Family Medicine

## 2019-03-22 ENCOUNTER — Other Ambulatory Visit: Payer: Self-pay

## 2019-03-22 ENCOUNTER — Encounter: Payer: Self-pay | Admitting: Family Medicine

## 2019-03-22 VITALS — BP 142/70 | HR 74 | Temp 98.0°F | Ht 69.25 in | Wt 183.5 lb

## 2019-03-22 DIAGNOSIS — R030 Elevated blood-pressure reading, without diagnosis of hypertension: Secondary | ICD-10-CM

## 2019-03-22 DIAGNOSIS — L570 Actinic keratosis: Secondary | ICD-10-CM | POA: Diagnosis not present

## 2019-03-22 NOTE — Progress Notes (Signed)
  Subjective:     Patient ID: Anthony Mendez, male   DOB: Jan 25, 1941, 78 y.o.   MRN: [id number]  HPI Patient seen to establish care.  He has been generally very healthy.  History of elevated PSA in the past with last checked last spring.  This was stable.  He takes baby aspirin but no other medications.  He had elevated blood pressure in the past but never treated with medication.  Does not monitor regularly.  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  He enjoys spending time with a golf course and at the beach.  He has 3 children all sons.  Feels well.  No specific complaints.  Denies any recent chest pains, dizziness, headache.  Past Medical History:  Diagnosis Date  . Essential hypertension 11/11/2016  . HEARING LOSS, BILATERAL 06/19/2009  . History of solar dermatitis   . Hyperlipidemia 11/11/2016   Past Surgical History:  Procedure Laterality Date  . TONSILLECTOMY      reports that he has never smoked. He has never used smokeless tobacco. He reports current alcohol use of about 1.0 standard drinks of alcohol per week. He reports that he does not use drugs. family history includes Hypertension in his mother; Stroke in his mother. No Known Allergies    Review of Systems  Constitutional: Negative for appetite change, fatigue and unexpected weight change.  Eyes: Negative for visual disturbance.  Respiratory: Negative for cough, chest tightness and shortness of breath.   Cardiovascular: Negative for chest pain, palpitations and leg swelling.  Gastrointestinal: Negative for abdominal pain.  Endocrine: Negative for polydipsia and polyuria.  Genitourinary: Negative for dysuria.  Neurological: Negative for dizziness, syncope, weakness, light-headedness and headaches.       Objective:   Physical Exam Constitutional:      Appearance: He is well-developed.  HENT:     Right Ear: External ear normal.     Left Ear: External ear  normal.  Eyes:     Pupils: Pupils are equal, round, and reactive to light.  Neck:     Musculoskeletal: Neck supple.     Thyroid: No thyromegaly.  Cardiovascular:     Rate and Rhythm: Normal rate and regular rhythm.  Pulmonary:     Effort: Pulmonary effort is normal. No respiratory distress.     Breath sounds: Normal breath sounds. No wheezing or rales.  Skin:    Comments: He has a couple scaly places including dorsum left hand and left external ear consistent with probable actinic keratoses.  No ulceration  Neurological:     Mental Status: He is alert and oriented to person, place, and time.        Assessment:     #1 history of mild elevated blood pressure without diagnosis of hypertension  #2 probable early small actinic keratoses of the hand and ear    Plan:     -Recommend set up physical.  Consider cryotherapy of lesions above at that time  -We discussed colonoscopy screening which she has never had.  He declines at this time.  [name] MD [facility] at [facility]

## 2019-03-22 NOTE — Patient Instructions (Signed)
Let's plan on setting up physical at some point next year.

## 2019-04-18 DIAGNOSIS — H60333 Swimmer's ear, bilateral: Secondary | ICD-10-CM | POA: Diagnosis not present

## 2019-04-18 DIAGNOSIS — H6123 Impacted cerumen, bilateral: Secondary | ICD-10-CM | POA: Diagnosis not present

## 2019-04-18 DIAGNOSIS — H722X2 Other marginal perforations of tympanic membrane, left ear: Secondary | ICD-10-CM | POA: Diagnosis not present

## 2019-08-08 ENCOUNTER — Telehealth: Payer: Self-pay

## 2019-08-08 NOTE — Telephone Encounter (Signed)
OK to continue care? I see you are listed as his current PCP.  Copied from [place] [id number]. Topic: Appointment Scheduling - New Patient >> Feb 21, 2019  2:32 PM Alanda Slim E wrote: New patient would like approval to be scheduled for your office. Provider: Dr. [name] or Dr. Sarajane Jews Pt has seen Dr. [name] in the past   Route to department's PEC pool.

## 2019-08-08 NOTE — Telephone Encounter (Signed)
I agreed to see him.  Thanks.

## 2019-08-08 NOTE — Telephone Encounter (Signed)
Please schedule this patient with Dr. [name]. Thanks!

## 2019-08-10 NOTE — Telephone Encounter (Signed)
I think this patient want to have a follow up, since he has already had a TOC visit. Thanks!

## 2019-08-10 NOTE — Telephone Encounter (Signed)
Patient had a TOC already with [name]

## 2019-08-16 NOTE — Telephone Encounter (Signed)
Left message with wife to have the patient contact our office to make a follow up appointment with Dr. [name].

## 2019-10-02 ENCOUNTER — Ambulatory Visit: Payer: Medicare Other | Attending: Internal Medicine

## 2019-10-02 DIAGNOSIS — Z23 Encounter for immunization: Secondary | ICD-10-CM

## 2019-10-02 NOTE — Progress Notes (Signed)
   Covid-19 Vaccination Clinic  Name:  ARLY SEGALLA    MRN: [id number] DOB: [birth year]  10/02/2019  Mr. Sane was observed post Covid-19 immunization for 15 minutes without incidence. He was provided with Vaccine Information Sheet and instruction to access the V-Safe system.   Mr. Waggle was instructed to call 911 with any severe reactions post vaccine: Marland Kitchen Difficulty breathing  . Swelling of your face and throat  . A fast heartbeat  . A bad rash all over your body  . Dizziness and weakness    Immunizations Administered    Name Date Dose VIS Date Route   Pfizer COVID-19 Vaccine 10/02/2019 10:48 AM 0.3 mL 08/19/2019 Intramuscular   Manufacturer: [place]   Lot: [id number]   [place]: [id number]

## 2019-10-04 ENCOUNTER — Ambulatory Visit (INDEPENDENT_AMBULATORY_CARE_PROVIDER_SITE_OTHER): Payer: Medicare Other | Admitting: Family Medicine

## 2019-10-04 ENCOUNTER — Other Ambulatory Visit: Payer: Self-pay

## 2019-10-04 ENCOUNTER — Encounter: Payer: Self-pay | Admitting: Family Medicine

## 2019-10-04 VITALS — BP 126/72 | HR 63 | Temp 97.2°F | Ht 69.0 in | Wt 184.7 lb

## 2019-10-04 DIAGNOSIS — Z0000 Encounter for general adult medical examination without abnormal findings: Secondary | ICD-10-CM

## 2019-10-04 NOTE — Patient Instructions (Signed)

## 2019-10-04 NOTE — Progress Notes (Signed)
Subjective:     Patient ID: Anthony Mendez, male   DOB: 10/04/1940, 79 y.o.   MRN: [id number]  HPI   Anthony Mendez is seen for physical.  Generally doing well.  Anthony Mendez stays very active with golf and also does some cycling.  His major issue has been chronic left ear issues.  Anthony Mendez has a large perforation.  Anthony Mendez has seen local ENT and is getting second opinion and consult at [facility] soon.  Anthony Mendez basically takes no regular prescription medications  Wt Readings from Last 3 Encounters:  10/04/19 184 lb 11.2 oz (83.8 kg)  03/22/19 183 lb 8 oz (83.2 kg)  03/22/18 192 lb (87.1 kg)   Health Maintenance  Topic Date Due  . INFLUENZA VACCINE  04/09/2019  . TETANUS/TDAP  07/25/2022  . PNA vac Low Risk Adult  Completed     Past Medical History:  Diagnosis Date  . Essential hypertension 11/11/2016  . HEARING LOSS, BILATERAL 06/19/2009  . History of solar dermatitis   . Hyperlipidemia 11/11/2016   Past Surgical History:  Procedure Laterality Date  . INNER EAR SURGERY    . TONSILLECTOMY      reports that Anthony Mendez has never smoked. Anthony Mendez has never used smokeless tobacco. Anthony Mendez reports current alcohol use of about 1.0 standard drinks of alcohol per week. Anthony Mendez reports that Anthony Mendez does not use drugs. family history includes Hypertension in his mother; Stroke in his mother. No Known Allergies    Review of Systems  Constitutional: Negative for activity change, appetite change, fatigue and fever.  HENT: Negative for congestion, ear pain and trouble swallowing.   Eyes: Negative for pain and visual disturbance.  Respiratory: Negative for cough, shortness of breath and wheezing.   Cardiovascular: Negative for chest pain and palpitations.  Gastrointestinal: Negative for abdominal distention, abdominal pain, blood in stool, constipation, diarrhea, nausea, rectal pain and vomiting.  Endocrine: Negative for polydipsia and polyuria.  Genitourinary: Negative for dysuria, hematuria and testicular pain.  Musculoskeletal: Negative for  arthralgias and joint swelling.  Skin: Negative for rash.  Neurological: Negative for dizziness, syncope and headaches.  Hematological: Negative for adenopathy.  Psychiatric/Behavioral: Negative for confusion and dysphoric mood.       Objective:   Physical Exam Vitals reviewed.  Constitutional:      General: Anthony Mendez is not in acute distress.    Appearance: Anthony Mendez is well-developed.  HENT:     Head: Normocephalic and atraumatic.     Right Ear: External ear normal.     Left Ear: External ear normal.  Eyes:     Conjunctiva/sclera: Conjunctivae normal.     Pupils: Pupils are equal, round, and reactive to light.  Neck:     Thyroid: No thyromegaly.  Cardiovascular:     Rate and Rhythm: Normal rate and regular rhythm.     Heart sounds: Normal heart sounds. No murmur.  Pulmonary:     Effort: No respiratory distress.     Breath sounds: No wheezing or rales.  Abdominal:     General: Bowel sounds are normal. There is no distension.     Palpations: Abdomen is soft. There is no mass.     Tenderness: There is no abdominal tenderness. There is no guarding or rebound.  Musculoskeletal:     Cervical back: Normal range of motion and neck supple.     Right lower leg: No edema.     Left lower leg: No edema.  Lymphadenopathy:     Cervical: No cervical adenopathy.  Skin:  Findings: No rash.  Neurological:     Mental Status: Anthony Mendez is alert and oriented to person, place, and time.     Cranial Nerves: No cranial nerve deficit.     Deep Tendon Reflexes: Reflexes normal.        Assessment:     Physical exam.  Patient has had ongoing issues with left-sided hearing loss and chronic perforation of the eardrum with chronic ear infections.  We discussed several health maintenance issues as below    Plan:     -Discussed shingles vaccine but Anthony Mendez wishes to wait till after Anthony Mendez gets Covid vaccine out of the way.  -Future lab order placed for this Thursday  -The natural history of prostate cancer and  ongoing controversy regarding screening and potential treatment outcomes of prostate cancer has been discussed with the patient. The meaning of a false positive PSA and a false negative PSA has been discussed. Anthony Mendez indicates understanding of the limitations of this screening test and wishes not to proceed with screening PSA testing.  -continue with regular exercise habits.

## 2019-10-10 DIAGNOSIS — H90A32 Mixed conductive and sensorineural hearing loss, unilateral, left ear with restricted hearing on the contralateral side: Secondary | ICD-10-CM | POA: Diagnosis not present

## 2019-10-10 DIAGNOSIS — H90A31 Mixed conductive and sensorineural hearing loss, unilateral, right ear with restricted hearing on the contralateral side: Secondary | ICD-10-CM | POA: Diagnosis not present

## 2019-10-10 DIAGNOSIS — H7292 Unspecified perforation of tympanic membrane, left ear: Secondary | ICD-10-CM | POA: Diagnosis not present

## 2019-10-24 ENCOUNTER — Ambulatory Visit: Payer: Medicare Other | Attending: Internal Medicine

## 2019-10-24 DIAGNOSIS — Z23 Encounter for immunization: Secondary | ICD-10-CM

## 2019-10-24 NOTE — Progress Notes (Signed)
   Covid-19 Vaccination Clinic  Name:  Anthony Mendez    MRN: [id number] DOB: [birth year]  10/24/2019  [name] was observed post Covid-19 immunization for 15 minutes without incidence. He was provided with Vaccine Information Sheet and instruction to access the V-Safe system.   [name] was instructed to call 911 with any severe reactions post vaccine: Marland Kitchen Difficulty breathing  . Swelling of your face and throat  . A fast heartbeat  . A bad rash all over your body  . Dizziness and weakness    Immunizations Administered    Name Date Dose VIS Date Route   Pfizer COVID-19 Vaccine 10/24/2019  9:10 AM 0.3 mL 08/19/2019 Intramuscular   Manufacturer: [place]   Lot: [id number]   [place]: [id number]

## 2019-10-25 DIAGNOSIS — H90A32 Mixed conductive and sensorineural hearing loss, unilateral, left ear with restricted hearing on the contralateral side: Secondary | ICD-10-CM | POA: Diagnosis not present

## 2019-10-25 DIAGNOSIS — H7292 Unspecified perforation of tympanic membrane, left ear: Secondary | ICD-10-CM | POA: Diagnosis not present

## 2019-10-25 DIAGNOSIS — H90A31 Mixed conductive and sensorineural hearing loss, unilateral, right ear with restricted hearing on the contralateral side: Secondary | ICD-10-CM | POA: Diagnosis not present

## 2019-11-07 DIAGNOSIS — Z20822 Contact with and (suspected) exposure to covid-19: Secondary | ICD-10-CM | POA: Diagnosis not present

## 2019-11-07 DIAGNOSIS — H9319 Tinnitus, unspecified ear: Secondary | ICD-10-CM | POA: Diagnosis not present

## 2019-11-07 DIAGNOSIS — H90A32 Mixed conductive and sensorineural hearing loss, unilateral, left ear with restricted hearing on the contralateral side: Secondary | ICD-10-CM | POA: Diagnosis not present

## 2019-11-07 DIAGNOSIS — E559 Vitamin D deficiency, unspecified: Secondary | ICD-10-CM | POA: Diagnosis not present

## 2019-11-07 DIAGNOSIS — H7292 Unspecified perforation of tympanic membrane, left ear: Secondary | ICD-10-CM | POA: Diagnosis not present

## 2019-11-07 DIAGNOSIS — H90A21 Sensorineural hearing loss, unilateral, right ear, with restricted hearing on the contralateral side: Secondary | ICD-10-CM | POA: Diagnosis not present

## 2019-11-10 DIAGNOSIS — H722X2 Other marginal perforations of tympanic membrane, left ear: Secondary | ICD-10-CM | POA: Diagnosis not present

## 2019-11-10 DIAGNOSIS — H903 Sensorineural hearing loss, bilateral: Secondary | ICD-10-CM | POA: Diagnosis not present

## 2019-11-10 DIAGNOSIS — H7292 Unspecified perforation of tympanic membrane, left ear: Secondary | ICD-10-CM | POA: Diagnosis not present

## 2019-11-29 DIAGNOSIS — H7292 Unspecified perforation of tympanic membrane, left ear: Secondary | ICD-10-CM | POA: Diagnosis not present

## 2019-11-29 DIAGNOSIS — H90A31 Mixed conductive and sensorineural hearing loss, unilateral, right ear with restricted hearing on the contralateral side: Secondary | ICD-10-CM | POA: Diagnosis not present

## 2019-12-27 DIAGNOSIS — H90A32 Mixed conductive and sensorineural hearing loss, unilateral, left ear with restricted hearing on the contralateral side: Secondary | ICD-10-CM | POA: Diagnosis not present

## 2019-12-27 DIAGNOSIS — H90A31 Mixed conductive and sensorineural hearing loss, unilateral, right ear with restricted hearing on the contralateral side: Secondary | ICD-10-CM | POA: Diagnosis not present

## 2019-12-27 DIAGNOSIS — H7292 Unspecified perforation of tympanic membrane, left ear: Secondary | ICD-10-CM | POA: Diagnosis not present

## 2019-12-28 ENCOUNTER — Other Ambulatory Visit: Payer: Self-pay | Admitting: Family Medicine

## 2020-02-20 DIAGNOSIS — H9212 Otorrhea, left ear: Secondary | ICD-10-CM | POA: Diagnosis not present

## 2020-02-20 DIAGNOSIS — H9313 Tinnitus, bilateral: Secondary | ICD-10-CM | POA: Diagnosis not present

## 2020-02-20 DIAGNOSIS — T161XXA Foreign body in right ear, initial encounter: Secondary | ICD-10-CM | POA: Diagnosis not present

## 2020-02-20 DIAGNOSIS — H7292 Unspecified perforation of tympanic membrane, left ear: Secondary | ICD-10-CM | POA: Diagnosis not present

## 2020-02-20 DIAGNOSIS — H906 Mixed conductive and sensorineural hearing loss, bilateral: Secondary | ICD-10-CM | POA: Diagnosis not present

## 2020-02-20 DIAGNOSIS — H90A31 Mixed conductive and sensorineural hearing loss, unilateral, right ear with restricted hearing on the contralateral side: Secondary | ICD-10-CM | POA: Diagnosis not present

## 2020-03-22 ENCOUNTER — Ambulatory Visit (INDEPENDENT_AMBULATORY_CARE_PROVIDER_SITE_OTHER): Payer: Medicare Other | Admitting: Ophthalmology

## 2020-03-22 ENCOUNTER — Other Ambulatory Visit: Payer: Self-pay

## 2020-03-22 ENCOUNTER — Encounter (INDEPENDENT_AMBULATORY_CARE_PROVIDER_SITE_OTHER): Payer: Self-pay | Admitting: Ophthalmology

## 2020-03-22 DIAGNOSIS — H35371 Puckering of macula, right eye: Secondary | ICD-10-CM | POA: Diagnosis not present

## 2020-03-22 DIAGNOSIS — H35372 Puckering of macula, left eye: Secondary | ICD-10-CM | POA: Diagnosis not present

## 2020-03-22 NOTE — Assessment & Plan Note (Addendum)
The nature of macular pucker (epiretinal membrane ERM) was discussed with the patient as well as threshold criteria for vitrectomy surgery. I explained that in rare cases another surgery is needed to actually remove a second wrinkle should it regrow.  Most often, the epiretinal membrane and underlying wrinkled internal limiting membrane are removed with the first surgery, to accomplish the goals.   If the operative eye is [place] (natural lens still present), cataract surgery is often recommended prior to Vitrectomy. This will enable the retina surgeon to have the best view during surgery and the patient to obtain optimal results in the future. Treatment options were discussed.  Risks and benefits reviewed patient has the option to proceed with surgery at any time his near vision is affected.

## 2020-03-22 NOTE — Progress Notes (Signed)
03/22/2020     CHIEF COMPLAINT Patient presents for Retina Follow Up   HISTORY OF PRESENT ILLNESS: Anthony Mendez is a 79 y.o. male who presents to the clinic today for:   HPI    Retina Follow Up    Patient presents with  Other.  In both eyes.  This started 6 months ago.  Duration of 6 months.  Since onset it is stable.          Comments    6 month f/u dilated exam and OCT(MAC) today.  Pt denies noticeable changes to [place] OU since last visit. Pt denies ocular pain, flashes of light, or floaters OU.         Last edited by Melburn Popper, COA on 03/22/2020 10:50 AM. (History)      Referring physician: [name], MD [place],  [place] [address]  HISTORICAL INFORMATION:   Selected notes from the MEDICAL RECORD NUMBER       CURRENT MEDICATIONS: No current outpatient medications on file. (Ophthalmic Drugs)   No current facility-administered medications for this visit. (Ophthalmic Drugs)   Current Outpatient Medications (Other)  Medication Sig  . aspirin 81 MG tablet Take 162 mg by mouth daily.   . ciprofloxacin (CIPRO) 500 MG tablet Take 500 mg by mouth 2 (two) times daily.  . ciprofloxacin-dexamethasone (CIPRODEX) OTIC suspension SHAKE LIQUID AND INSTILL 3 DROPS IN LEFT EAR THREE TIMES DAILY FOR 10 DAYS  . ciprofloxacin-dexamethasone (CIPRODEX) OTIC suspension SHAKE LIQUID AND INSTILL 4 DROPS IN BOTH EARS TWICE DAILY FOR 10 DAYS  . fluticasone (FLONASE) 50 MCG/ACT nasal spray   . neomycin-polymyxin-hydrocortisone (CORTISPORIN) 3.5-10000-1 OTIC suspension INSTILL 4 TO 5 DROPS IN LEFT EAR BID PRF DRAINAGE   No current facility-administered medications for this visit. (Other)      REVIEW OF SYSTEMS:    ALLERGIES No Known Allergies  PAST MEDICAL HISTORY Past Medical History:  Diagnosis Date  . Essential hypertension 11/11/2016  . HEARING LOSS, BILATERAL 06/19/2009  . History of solar dermatitis   . Hyperlipidemia 11/11/2016    Past Surgical History:  Procedure Laterality Date  . INNER EAR SURGERY    . TONSILLECTOMY      FAMILY HISTORY Family History  Problem Relation Age of Onset  . Stroke Mother   . Hypertension Mother     SOCIAL HISTORY Social History   Tobacco Use  . Smoking status: Never Smoker  . Smokeless tobacco: Never Used  Vaping Use  . Vaping Use: Never used  Substance Use Topics  . Alcohol use: Yes    Alcohol/week: 1.0 standard drink    Types: 1 Glasses of wine per week    Comment: every night   . Drug use: No         OPHTHALMIC EXAM:  Base Eye Exam    Visual Acuity (ETDRS)      Right Left   Dist [place] 20/25 -2 20/25 +2       Tonometry (Tonopen, 10:55 AM)      Right Left   Pressure 19 20       Pupils      Pupils Dark Light Shape React APD   Right PERRL 5 4 Round Brisk None   Left PERRL 5 4 Round Brisk None       Visual Fields (Counting fingers)      Left Right    Full Full       Extraocular Movement  Right Left    Full Full       Neuro/Psych    Oriented x3: Yes   Mood/Affect: Normal       Dilation    Both eyes: 1.0% Mydriacyl, 2.5% Phenylephrine @ 10:55 AM        Slit Lamp and Fundus Exam    External Exam      Right Left   External Normal Normal       Slit Lamp Exam      Right Left   Lids/Lashes Normal Normal   Conjunctiva/Sclera White and quiet White and quiet   Cornea Clear Clear   Anterior Chamber Deep and quiet Deep and quiet   Iris Round and reactive Round and reactive   Lens Posterior chamber intraocular lens Posterior chamber intraocular lens   Anterior Vitreous Normal Normal       Fundus Exam      Right Left   Posterior Vitreous Posterior vitreous detachment    C/D Ratio 0.3 0.25   Macula Epiretinal membrane, moderate topo distortion           IMAGING AND PROCEDURES  Imaging and Procedures for 03/22/20  OCT, Retina - OU - Both Eyes       Right Eye Quality was good. Scan locations included subfoveal. Central  Foveal Thickness: 396. Findings include epiretinal membrane.   Left Eye Quality was good. Scan locations included subfoveal. Central Foveal Thickness: 274. Findings include epiretinal membrane.                 ASSESSMENT/PLAN:  Right epiretinal membrane The nature of macular pucker (epiretinal membrane ERM) was discussed with the patient as well as threshold criteria for vitrectomy surgery. I explained that in rare cases another surgery is needed to actually remove a second wrinkle should it regrow.  Most often, the epiretinal membrane and underlying wrinkled internal limiting membrane are removed with the first surgery, to accomplish the goals.   If the operative eye is [place] (natural lens still present), cataract surgery is often recommended prior to Vitrectomy. This will enable the retina surgeon to have the best view during surgery and the patient to obtain optimal results in the future. Treatment options were discussed.  Risks and benefits reviewed patient has the option to proceed with surgery at any time his near vision is affected.      ICD-10-CM   1. Right epiretinal membrane  H35.371 OCT, Retina - OU - Both Eyes  2. Left epiretinal membrane  H35.372 OCT, Retina - OU - Both Eyes    1.  Patient may return for follow-up visit at any time or in 6 months with an OCT.  2.  3.  Ophthalmic Meds Ordered this visit:  No orders of the defined types were placed in this encounter.      Return in about 6 months (around 09/22/2020) for DILATE OU, OCT.  There are no Patient Instructions on file for this visit.   Explained the diagnoses, plan, and follow up with the patient and they expressed understanding.  Patient expressed understanding of the importance of proper follow up care.   Clent Demark Elva Breaker M.D. Diseases & Surgery of the Retina and Vitreous Retina & Diabetic [place] 03/22/20     Abbreviations: M myopia (nearsighted); A astigmatism; H hyperopia (farsighted);  P presbyopia; Mrx spectacle prescription;  CTL contact lenses; OD right eye; OS left eye; OU both eyes  XT exotropia; ET esotropia; PEK punctate epithelial keratitis; PEE punctate epithelial erosions; DES  dry eye syndrome; MGD meibomian gland dysfunction; ATs artificial tears; PFAT's preservative free artificial tears; [place] nuclear sclerotic cataract; PSC posterior subcapsular cataract; ERM epi-retinal membrane; PVD posterior vitreous detachment; RD retinal detachment; DM diabetes mellitus; DR diabetic retinopathy; NPDR non-proliferative diabetic retinopathy; PDR proliferative diabetic retinopathy; CSME clinically significant macular edema; DME diabetic macular edema; dbh dot blot hemorrhages; CWS cotton wool spot; POAG primary open angle glaucoma; C/D cup-to-disc ratio; HVF humphrey visual field; GVF goldmann visual field; OCT optical coherence tomography; IOP intraocular pressure; BRVO Branch retinal vein occlusion; CRVO central retinal vein occlusion; CRAO central retinal artery occlusion; BRAO branch retinal artery occlusion; RT retinal tear; SB scleral buckle; PPV pars plana vitrectomy; VH Vitreous hemorrhage; PRP panretinal laser photocoagulation; IVK intravitreal kenalog; VMT vitreomacular traction; MH Macular hole;  NVD neovascularization of the disc; NVE neovascularization elsewhere; AREDS age related eye disease study; ARMD age related macular degeneration; POAG primary open angle glaucoma; EBMD epithelial/anterior basement membrane dystrophy; ACIOL anterior chamber intraocular lens; IOL intraocular lens; PCIOL posterior chamber intraocular lens; Phaco/IOL phacoemulsification with intraocular lens placement; [place] photorefractive keratectomy; LASIK laser assisted in situ keratomileusis; HTN hypertension; DM diabetes mellitus; COPD chronic obstructive pulmonary disease

## 2020-03-28 ENCOUNTER — Ambulatory Visit (INDEPENDENT_AMBULATORY_CARE_PROVIDER_SITE_OTHER): Payer: Medicare Other

## 2020-03-28 ENCOUNTER — Other Ambulatory Visit: Payer: Self-pay

## 2020-03-28 ENCOUNTER — Encounter (INDEPENDENT_AMBULATORY_CARE_PROVIDER_SITE_OTHER): Payer: Self-pay

## 2020-03-28 DIAGNOSIS — H35371 Puckering of macula, right eye: Secondary | ICD-10-CM

## 2020-03-28 MED ORDER — PREDNISOLONE ACETATE 1 % OP SUSP: 1.0000 [drp] | Freq: Four times a day (QID) | OPHTHALMIC | 0 refills | Status: DC

## 2020-03-28 MED ORDER — OFLOXACIN 0.3 % OP SOLN: 1.0000 [drp] | Freq: Four times a day (QID) | OPHTHALMIC | 0 refills | Status: DC

## 2020-03-28 NOTE — Progress Notes (Signed)
03/28/2020     CHIEF COMPLAINT Patient presents for Retina Follow Up   HISTORY OF PRESENT ILLNESS: Anthony Mendez is a 79 y.o. male who presents to the clinic today for:   HPI    Retina Follow Up    Patient presents with  Other.  In right eye.  Duration of 1 week.  Since onset it is stable.          Comments    Pre-op for ERM OD - Vitrectomy 25G & membrane peel OD  Patient denies changes.       Last edited by Gerda Diss on 03/28/2020 11:18 AM. (History)        HISTORICAL INFORMATION:   Selected notes from the [place]: No current outpatient medications on file. (Ophthalmic Drugs)   No current facility-administered medications for this visit. (Ophthalmic Drugs)   Current Outpatient Medications (Other)  Medication Sig  . aspirin 81 MG tablet Take 162 mg by mouth daily.   . ciprofloxacin (CIPRO) 500 MG tablet Take 500 mg by mouth 2 (two) times daily.  . ciprofloxacin-dexamethasone (CIPRODEX) OTIC suspension SHAKE LIQUID AND INSTILL 3 DROPS IN LEFT EAR THREE TIMES DAILY FOR 10 DAYS  . ciprofloxacin-dexamethasone (CIPRODEX) OTIC suspension SHAKE LIQUID AND INSTILL 4 DROPS IN BOTH EARS TWICE DAILY FOR 10 DAYS  . fluticasone (FLONASE) 50 MCG/ACT nasal spray   . neomycin-polymyxin-hydrocortisone (CORTISPORIN) 3.5-10000-1 OTIC suspension INSTILL 4 TO 5 DROPS IN LEFT EAR BID PRF DRAINAGE   No current facility-administered medications for this visit. (Other)     ALLERGIES No Known Allergies  PAST MEDICAL HISTORY Past Medical History:  Diagnosis Date  . Essential hypertension 11/11/2016  . HEARING LOSS, BILATERAL 06/19/2009  . History of solar dermatitis   . Hyperlipidemia 11/11/2016   Past Surgical History:  Procedure Laterality Date  . INNER EAR SURGERY    . TONSILLECTOMY      FAMILY HISTORY Family History  Problem Relation Age of Onset  . Stroke Mother   . Hypertension Mother     SOCIAL HISTORY Social History     Tobacco Use  . Smoking status: Never Smoker  . Smokeless tobacco: Never Used  Vaping Use  . Vaping Use: Never used  Substance Use Topics  . Alcohol use: Yes    Alcohol/week: 1.0 standard drink    Types: 1 Glasses of wine per week    Comment: every night   . Drug use: No         OPHTHALMIC EXAM:  Base Eye Exam    Visual Acuity (Snellen - Linear)      Right Left   Dist [place] 20/40+2 20/30+1   Dist ph [place] NI        Tonometry (Tonopen, 11:24 AM)      Right Left   Pressure 15 14          IMAGING AND PROCEDURES  Imaging and Procedures for @TODAY @           ASSESSMENT/PLAN:    ICD-10-CM   1. Right epiretinal membrane  H35.371     Ophthalmic Meds Ordered this visit:  No orders of the defined types were placed in this encounter.       Pre-op completed. Operative consent obtained with pre-op eye drops reviewed with [name] and sent via [facility] as needed. Post op instructions reviewed with patient and per patient all questions answered.  Gerda Diss

## 2020-04-04 ENCOUNTER — Encounter (INDEPENDENT_AMBULATORY_CARE_PROVIDER_SITE_OTHER): Payer: Medicare Other | Admitting: Ophthalmology

## 2020-04-04 DIAGNOSIS — H35371 Puckering of macula, right eye: Secondary | ICD-10-CM

## 2020-04-04 HISTORY — PX: EYE SURGERY: SHX253

## 2020-04-05 ENCOUNTER — Other Ambulatory Visit: Payer: Self-pay

## 2020-04-05 ENCOUNTER — Ambulatory Visit (INDEPENDENT_AMBULATORY_CARE_PROVIDER_SITE_OTHER): Payer: Medicare Other | Admitting: Ophthalmology

## 2020-04-05 ENCOUNTER — Encounter (INDEPENDENT_AMBULATORY_CARE_PROVIDER_SITE_OTHER): Payer: Self-pay | Admitting: Ophthalmology

## 2020-04-05 DIAGNOSIS — H35371 Puckering of macula, right eye: Secondary | ICD-10-CM

## 2020-04-05 NOTE — Assessment & Plan Note (Signed)
Postoperative day #1 status post vitrectomy, internal limiting membrane peel right eye. Looks great today

## 2020-04-05 NOTE — Progress Notes (Signed)
04/05/2020     CHIEF COMPLAINT Patient presents for Post-op Follow-up   HISTORY OF PRESENT ILLNESS: Anthony Mendez is a 79 y.o. male who presents to the clinic today for:   HPI    Post-op Follow-up    In right eye.  Discomfort includes pain and discharge.  Vision is stable.  I, the attending physician,  performed the HPI with the patient and updated documentation appropriately.          Comments    1 Day s\p Vitrectomy and Membrane Peel OD  Pt states he had some pain last night. Pt took tylenol. Pt states he laid on left side and the pain went away. Pt also states he had some discharge last night.       Last edited by Tilda Franco on 04/05/2020  8:05 AM. (History)      Referring physician: [name], MD [name],  [place] [address]  HISTORICAL INFORMATION:   Selected notes from the MEDICAL RECORD NUMBER       CURRENT MEDICATIONS: Current Outpatient Medications (Ophthalmic Drugs)  Medication Sig  . ofloxacin (OCUFLOX) 0.3 % ophthalmic solution Place 1 drop into the right eye 4 (four) times daily.  . prednisoLONE acetate (PRED FORTE) 1 % ophthalmic suspension Place 1 drop into the right eye 4 (four) times daily.   No current facility-administered medications for this visit. (Ophthalmic Drugs)   Current Outpatient Medications (Other)  Medication Sig  . aspirin 81 MG tablet Take 162 mg by mouth daily.   . ciprofloxacin (CIPRO) 500 MG tablet Take 500 mg by mouth 2 (two) times daily.  . ciprofloxacin-dexamethasone (CIPRODEX) OTIC suspension SHAKE LIQUID AND INSTILL 3 DROPS IN LEFT EAR THREE TIMES DAILY FOR 10 DAYS  . ciprofloxacin-dexamethasone (CIPRODEX) OTIC suspension SHAKE LIQUID AND INSTILL 4 DROPS IN BOTH EARS TWICE DAILY FOR 10 DAYS  . fluticasone (FLONASE) 50 MCG/ACT nasal spray   . neomycin-polymyxin-hydrocortisone (CORTISPORIN) 3.5-10000-1 OTIC suspension INSTILL 4 TO 5 DROPS IN LEFT EAR BID PRF DRAINAGE   No current  facility-administered medications for this visit. (Other)      REVIEW OF SYSTEMS:    ALLERGIES No Known Allergies  PAST MEDICAL HISTORY Past Medical History:  Diagnosis Date  . Essential hypertension 11/11/2016  . HEARING LOSS, BILATERAL 06/19/2009  . History of solar dermatitis   . Hyperlipidemia 11/11/2016   Past Surgical History:  Procedure Laterality Date  . EYE SURGERY Right 04/04/2020   Vitrectomy, Membrane Peel  . INNER EAR SURGERY    . TONSILLECTOMY      FAMILY HISTORY Family History  Problem Relation Age of Onset  . Stroke Mother   . Hypertension Mother     SOCIAL HISTORY Social History   Tobacco Use  . Smoking status: Never Smoker  . Smokeless tobacco: Never Used  Vaping Use  . Vaping Use: Never used  Substance Use Topics  . Alcohol use: Yes    Alcohol/week: 1.0 standard drink    Types: 1 Glasses of wine per week    Comment: every night   . Drug use: No         OPHTHALMIC EXAM:  Base Eye Exam    Visual Acuity (Snellen - Linear)      Right Left   Dist [place] 20/30 -1 20/25 -2       Tonometry (Tonopen, 8:10 AM)      Right Left   Pressure 15 17       Pupils  Dark Light Shape React   Right 7 7 Round Dilated   Left           Neuro/Psych    Oriented x3: Yes   Mood/Affect: Normal        Slit Lamp and Fundus Exam    External Exam      Right Left   External Normal Normal       Slit Lamp Exam      Right Left   Lids/Lashes Normal Normal   Conjunctiva/Sclera White and quiet White and quiet   Cornea Clear Clear   Anterior Chamber Deep and quiet Deep and quiet   Iris Round and reactive Round and reactive   Lens Posterior chamber intraocular lens Posterior chamber intraocular lens   Anterior Vitreous Normal Normal       Fundus Exam      Right Left   Posterior Vitreous Vitrectomized, clear    Disc Normal    C/D Ratio 0.3    Macula Less topo distortion    Vessels Normal    Periphery Normal           IMAGING AND  PROCEDURES  Imaging and Procedures for 04/05/20           ASSESSMENT/PLAN:  Right epiretinal membrane Postoperative day #1 status post vitrectomy, internal limiting membrane peel right eye. Looks great today      ICD-10-CM   1. Right epiretinal membrane  H35.371     1. Resume topical medications right eye, ofloxacin right eye 4 times daily as well as prednisolone acetate 1 drop right eye 4 times daily  2.  3.  Ophthalmic Meds Ordered this visit:  No orders of the defined types were placed in this encounter.      Return in about 1 week (around 04/12/2020) for dilate, OD, OCT.  There are no Patient Instructions on file for this visit.   Explained the diagnoses, plan, and follow up with the patient and they expressed understanding.  Patient expressed understanding of the importance of proper follow up care.   Clent Demark Cristofer Yaffe M.D. Diseases & Surgery of the Retina and Vitreous Retina & Diabetic [place] 04/05/20     Abbreviations: M myopia (nearsighted); A astigmatism; H hyperopia (farsighted); P presbyopia; Mrx spectacle prescription;  CTL contact lenses; OD right eye; OS left eye; OU both eyes  XT exotropia; ET esotropia; PEK punctate epithelial keratitis; PEE punctate epithelial erosions; DES dry eye syndrome; MGD meibomian gland dysfunction; ATs artificial tears; PFAT's preservative free artificial tears; [place] nuclear sclerotic cataract; PSC posterior subcapsular cataract; ERM epi-retinal membrane; PVD posterior vitreous detachment; RD retinal detachment; DM diabetes mellitus; DR diabetic retinopathy; NPDR non-proliferative diabetic retinopathy; PDR proliferative diabetic retinopathy; CSME clinically significant macular edema; DME diabetic macular edema; dbh dot blot hemorrhages; CWS cotton wool spot; POAG primary open angle glaucoma; C/D cup-to-disc ratio; HVF humphrey visual field; GVF goldmann visual field; OCT optical coherence tomography; IOP intraocular pressure;  BRVO Branch retinal vein occlusion; CRVO central retinal vein occlusion; CRAO central retinal artery occlusion; BRAO branch retinal artery occlusion; RT retinal tear; SB scleral buckle; PPV pars plana vitrectomy; VH Vitreous hemorrhage; PRP panretinal laser photocoagulation; IVK intravitreal kenalog; VMT vitreomacular traction; MH Macular hole;  NVD neovascularization of the disc; NVE neovascularization elsewhere; AREDS age related eye disease study; ARMD age related macular degeneration; POAG primary open angle glaucoma; EBMD epithelial/anterior basement membrane dystrophy; ACIOL anterior chamber intraocular lens; IOL intraocular lens; PCIOL posterior chamber intraocular lens; Phaco/IOL phacoemulsification with intraocular lens  placement; [place] photorefractive keratectomy; LASIK laser assisted in situ keratomileusis; HTN hypertension; DM diabetes mellitus; COPD chronic obstructive pulmonary disease

## 2020-04-12 ENCOUNTER — Ambulatory Visit (INDEPENDENT_AMBULATORY_CARE_PROVIDER_SITE_OTHER): Payer: Medicare Other | Admitting: Ophthalmology

## 2020-04-12 ENCOUNTER — Encounter (INDEPENDENT_AMBULATORY_CARE_PROVIDER_SITE_OTHER): Payer: Self-pay | Admitting: Ophthalmology

## 2020-04-12 ENCOUNTER — Other Ambulatory Visit: Payer: Self-pay

## 2020-04-12 DIAGNOSIS — H35371 Puckering of macula, right eye: Secondary | ICD-10-CM | POA: Diagnosis not present

## 2020-04-12 NOTE — Assessment & Plan Note (Addendum)
No lifting and bending for 1 week. No water in the eye for 10 days. Do not rub the eye. Wear shield at night for 1-3 days.  Wear your CPAP as normal, if instructed by your doctor.  Continue your topical medications for a total of 3 weeks.  Do not refill your postoperative medications unless instructed.   1 week postop vitrectomy membrane peel, looks great, visual acuity should slowly improve with nightly stabilize

## 2020-04-12 NOTE — Progress Notes (Signed)
04/12/2020     CHIEF COMPLAINT Patient presents for Post-op Follow-up  1 week post vitrectomy membrane peel OD HISTORY OF PRESENT ILLNESS: Anthony Mendez is a 79 y.o. male who presents to the clinic today for:   HPI    Post-op Follow-up    In right eye.  Discomfort includes none.  Negative for pain.  Vision is stable.  I, the attending physician,  performed the HPI with the patient and updated documentation appropriately.          Comments    1 Week s\p Vitrecomy and Membrane Peel OD  Pt denies any pain. Pt states OD is doing well and vision is starting to clear. Pt using gtts as directed.       Last edited by Tilda Franco on 04/12/2020  9:49 AM. (History)      Referring physician: [name], MD [place],  [place] [address]  HISTORICAL INFORMATION:   Selected notes from the MEDICAL RECORD NUMBER       CURRENT MEDICATIONS: Current Outpatient Medications (Ophthalmic Drugs)  Medication Sig  . ofloxacin (OCUFLOX) 0.3 % ophthalmic solution Place 1 drop into the right eye 4 (four) times daily.  . prednisoLONE acetate (PRED FORTE) 1 % ophthalmic suspension Place 1 drop into the right eye 4 (four) times daily.   No current facility-administered medications for this visit. (Ophthalmic Drugs)   Current Outpatient Medications (Other)  Medication Sig  . aspirin 81 MG tablet Take 162 mg by mouth daily.   . ciprofloxacin (CIPRO) 500 MG tablet Take 500 mg by mouth 2 (two) times daily.  . ciprofloxacin-dexamethasone (CIPRODEX) OTIC suspension SHAKE LIQUID AND INSTILL 3 DROPS IN LEFT EAR THREE TIMES DAILY FOR 10 DAYS  . ciprofloxacin-dexamethasone (CIPRODEX) OTIC suspension SHAKE LIQUID AND INSTILL 4 DROPS IN BOTH EARS TWICE DAILY FOR 10 DAYS  . fluticasone (FLONASE) 50 MCG/ACT nasal spray   . neomycin-polymyxin-hydrocortisone (CORTISPORIN) 3.5-10000-1 OTIC suspension INSTILL 4 TO 5 DROPS IN LEFT EAR BID PRF DRAINAGE   No current  facility-administered medications for this visit. (Other)      REVIEW OF SYSTEMS:    ALLERGIES No Known Allergies  PAST MEDICAL HISTORY Past Medical History:  Diagnosis Date  . Essential hypertension 11/11/2016  . HEARING LOSS, BILATERAL 06/19/2009  . History of solar dermatitis   . Hyperlipidemia 11/11/2016   Past Surgical History:  Procedure Laterality Date  . EYE SURGERY Right 04/04/2020   Vitrectomy, Membrane Peel  . INNER EAR SURGERY    . TONSILLECTOMY      FAMILY HISTORY Family History  Problem Relation Age of Onset  . Stroke Mother   . Hypertension Mother     SOCIAL HISTORY Social History   Tobacco Use  . Smoking status: Never Smoker  . Smokeless tobacco: Never Used  Vaping Use  . Vaping Use: Never used  Substance Use Topics  . Alcohol use: Yes    Alcohol/week: 1.0 standard drink    Types: 1 Glasses of wine per week    Comment: every night   . Drug use: No         OPHTHALMIC EXAM:  Base Eye Exam    Visual Acuity (Snellen - Linear)      Right Left   Dist [place] 20/30 -2 20/20 -1       Tonometry (Tonopen, 9:53 AM)      Right Left   Pressure 15 12       Pupils  Pupils Dark Light Shape React APD   Right PERRL 3 2 Round Brisk None   Left PERRL 3 3 Irregular Minimal None       Visual Fields (Counting fingers)      Left Right    Full Full       Neuro/Psych    Oriented x3: Yes   Mood/Affect: Normal       Dilation    Right eye: 1.0% Mydriacyl, 2.5% Phenylephrine @ 9:53 AM        Slit Lamp and Fundus Exam    External Exam      Right Left   External Normal Normal       Slit Lamp Exam      Right Left   Lids/Lashes Normal Normal   Conjunctiva/Sclera White and quiet White and quiet   Cornea Clear Clear   Anterior Chamber Deep and quiet Deep and quiet   Iris Round and reactive Round and reactive   Lens Posterior chamber intraocular lens Posterior chamber intraocular lens   Anterior Vitreous Normal Normal       Fundus Exam        Right Left   Posterior Vitreous Vitrectomized, clear    Disc Normal    C/D Ratio 0.3    Macula Less topo distortion    Vessels Normal    Periphery Normal           IMAGING AND PROCEDURES  Imaging and Procedures for 04/12/20  OCT, Retina - OU - Both Eyes       Right Eye Quality was good. Scan locations included subfoveal. Central Foveal Thickness: 397. Progression has been stable.   Left Eye Quality was good. Scan locations included subfoveal. Central Foveal Thickness: 272. Progression has been stable.   Notes OD, much improved post 1 week post vitrectomy and internal limiting membrane peel.                ASSESSMENT/PLAN:  Right epiretinal membrane No lifting and bending for 1 week. No water in the eye for 10 days. Do not rub the eye. Wear shield at night for 1-3 days.  Wear your CPAP as normal, if instructed by your doctor.  Continue your topical medications for a total of 3 weeks.  Do not refill your postoperative medications unless instructed.   1 week postop vitrectomy membrane peel, looks great, visual acuity should slowly improve with nightly stabilize      ICD-10-CM   1. Right epiretinal membrane  H35.371 OCT, Retina - OU - Both Eyes    1.  Patient to complete his topical medications over the next 2 weeks or continue the use if any remain in the bottles and at the end of 2 weeks  2.  3.  Ophthalmic Meds Ordered this visit:  No orders of the defined types were placed in this encounter.      Return in about 6 weeks (around 05/24/2020) for dilate, OD, OCT.  There are no Patient Instructions on file for this visit.   Explained the diagnoses, plan, and follow up with the patient and they expressed understanding.  Patient expressed understanding of the importance of proper follow up care.   Clent Demark Hilery Wintle M.D. Diseases & Surgery of the Retina and Vitreous Retina & Diabetic [place] 04/12/20     Abbreviations: M myopia (nearsighted);  A astigmatism; H hyperopia (farsighted); P presbyopia; Mrx spectacle prescription;  CTL contact lenses; OD right eye; OS left eye; OU both eyes  XT exotropia;  ET esotropia; PEK punctate epithelial keratitis; PEE punctate epithelial erosions; DES dry eye syndrome; MGD meibomian gland dysfunction; ATs artificial tears; PFAT's preservative free artificial tears; [place] nuclear sclerotic cataract; PSC posterior subcapsular cataract; ERM epi-retinal membrane; PVD posterior vitreous detachment; RD retinal detachment; DM diabetes mellitus; DR diabetic retinopathy; NPDR non-proliferative diabetic retinopathy; PDR proliferative diabetic retinopathy; CSME clinically significant macular edema; DME diabetic macular edema; dbh dot blot hemorrhages; CWS cotton wool spot; POAG primary open angle glaucoma; C/D cup-to-disc ratio; HVF humphrey visual field; GVF goldmann visual field; OCT optical coherence tomography; IOP intraocular pressure; BRVO Branch retinal vein occlusion; CRVO central retinal vein occlusion; CRAO central retinal artery occlusion; BRAO branch retinal artery occlusion; RT retinal tear; SB scleral buckle; PPV pars plana vitrectomy; VH Vitreous hemorrhage; PRP panretinal laser photocoagulation; IVK intravitreal kenalog; VMT vitreomacular traction; MH Macular hole;  NVD neovascularization of the disc; NVE neovascularization elsewhere; AREDS age related eye disease study; ARMD age related macular degeneration; POAG primary open angle glaucoma; EBMD epithelial/anterior basement membrane dystrophy; ACIOL anterior chamber intraocular lens; IOL intraocular lens; PCIOL posterior chamber intraocular lens; Phaco/IOL phacoemulsification with intraocular lens placement; [place] photorefractive keratectomy; LASIK laser assisted in situ keratomileusis; HTN hypertension; DM diabetes mellitus; COPD chronic obstructive pulmonary disease

## 2020-05-01 DIAGNOSIS — H90A32 Mixed conductive and sensorineural hearing loss, unilateral, left ear with restricted hearing on the contralateral side: Secondary | ICD-10-CM | POA: Diagnosis not present

## 2020-05-01 DIAGNOSIS — H7292 Unspecified perforation of tympanic membrane, left ear: Secondary | ICD-10-CM | POA: Diagnosis not present

## 2020-05-01 DIAGNOSIS — H90A31 Mixed conductive and sensorineural hearing loss, unilateral, right ear with restricted hearing on the contralateral side: Secondary | ICD-10-CM | POA: Diagnosis not present

## 2020-05-01 DIAGNOSIS — H903 Sensorineural hearing loss, bilateral: Secondary | ICD-10-CM | POA: Diagnosis not present

## 2020-05-17 ENCOUNTER — Ambulatory Visit (INDEPENDENT_AMBULATORY_CARE_PROVIDER_SITE_OTHER): Payer: Medicare Other | Admitting: Ophthalmology

## 2020-05-17 ENCOUNTER — Encounter (INDEPENDENT_AMBULATORY_CARE_PROVIDER_SITE_OTHER): Payer: Self-pay | Admitting: Ophthalmology

## 2020-05-17 ENCOUNTER — Other Ambulatory Visit: Payer: Self-pay

## 2020-05-17 DIAGNOSIS — H35371 Puckering of macula, right eye: Secondary | ICD-10-CM

## 2020-05-17 DIAGNOSIS — G453 Amaurosis fugax: Secondary | ICD-10-CM | POA: Diagnosis not present

## 2020-05-17 NOTE — Patient Instructions (Signed)
I have asked the patient to contact Dr. Darnell Level [name]'s office to arrange for complete examination, physical and evaluation of amaurosis fugax right eye to include carotid Doppler and echocardiogram and possibly MRA

## 2020-05-17 NOTE — Assessment & Plan Note (Addendum)
This condition abated last week on its own in a brief period of time nonetheless the monocular appearance of this type of vision change requires proximal vascular evaluation particular attention to the carotids for vascular disease.  I will asked the patient to promptly contact Dr. Carolann Littler who may arrange full examination in addition to carotid Doppler evaluation, echocardiogram looking for intracardiac lesions that could explain this.  Patient reports he is scheduled for an MRI at [facility] for evaluation of hearing loss on the left side of idiopathic origin.  While each of these might represent proximal vascular disease, they would not be from the same lesion of the major neck vessels.  In the meantime patient should consider continue to take aspirin 325 mg daily. He reports using using using now 81 mg, twice daily, and I have suggested to either take 4 of these a day or to take 1 325 mg tablet daily

## 2020-05-17 NOTE — Assessment & Plan Note (Signed)
Macular anatomy of the right eye continues to improve nicely coincident with visual acuity clarity both on examination objectively and by patient's report

## 2020-05-17 NOTE — Progress Notes (Signed)
05/17/2020     CHIEF COMPLAINT Patient presents for Post-op Follow-up   HISTORY OF PRESENT ILLNESS: Anthony Mendez is a 79 y.o. male who presents to the clinic today for:   HPI    Post-op Follow-up    In right eye.  Vision is improved.          Comments    6 Week POV OD  Pt reports improved VA OD. Pt sts, "I am very pleased." Pt denies ocular pain. Patient describes an event last week where upon bending down he had. Of amaurosis fugax, with total loss going black vision only in the right eye with no involvement left eye while he was bending down doing some work on his private boat. Upon standing this developed and then he sat down and awaited and over few minutes the vision returned initially from the lower visual field progressing superiorly. This did not respect the vertical midline but rather the horizontal midline as it recovered.        Last edited by Hurman Horn, MD on 05/17/2020  9:37 AM. (History)      Referring physician: [name], MD [place],  [place] [address]  HISTORICAL INFORMATION:   Selected notes from the MEDICAL RECORD NUMBER       CURRENT MEDICATIONS: Current Outpatient Medications (Ophthalmic Drugs)  Medication Sig  . ofloxacin (OCUFLOX) 0.3 % ophthalmic solution Place 1 drop into the right eye 4 (four) times daily. (Patient not taking: Reported on 05/17/2020)  . prednisoLONE acetate (PRED FORTE) 1 % ophthalmic suspension Place 1 drop into the right eye 4 (four) times daily. (Patient not taking: Reported on 05/17/2020)   No current facility-administered medications for this visit. (Ophthalmic Drugs)   Current Outpatient Medications (Other)  Medication Sig  . aspirin 81 MG tablet Take 162 mg by mouth daily.   . ciprofloxacin (CIPRO) 500 MG tablet Take 500 mg by mouth 2 (two) times daily.  . ciprofloxacin-dexamethasone (CIPRODEX) OTIC suspension SHAKE LIQUID AND INSTILL 3 DROPS IN LEFT EAR THREE TIMES DAILY FOR 10 DAYS  .  ciprofloxacin-dexamethasone (CIPRODEX) OTIC suspension SHAKE LIQUID AND INSTILL 4 DROPS IN BOTH EARS TWICE DAILY FOR 10 DAYS  . fluticasone (FLONASE) 50 MCG/ACT nasal spray   . neomycin-polymyxin-hydrocortisone (CORTISPORIN) 3.5-10000-1 OTIC suspension INSTILL 4 TO 5 DROPS IN LEFT EAR BID PRF DRAINAGE   No current facility-administered medications for this visit. (Other)      REVIEW OF SYSTEMS:    ALLERGIES No Known Allergies  PAST MEDICAL HISTORY Past Medical History:  Diagnosis Date  . Essential hypertension 11/11/2016  . HEARING LOSS, BILATERAL 06/19/2009  . History of solar dermatitis   . Hyperlipidemia 11/11/2016   Past Surgical History:  Procedure Laterality Date  . EYE SURGERY Right 04/04/2020   Vitrectomy, Membrane Peel  . INNER EAR SURGERY    . TONSILLECTOMY      FAMILY HISTORY Family History  Problem Relation Age of Onset  . Stroke Mother   . Hypertension Mother     SOCIAL HISTORY Social History   Tobacco Use  . Smoking status: Never Smoker  . Smokeless tobacco: Never Used  Vaping Use  . Vaping Use: Never used  Substance Use Topics  . Alcohol use: Yes    Alcohol/week: 1.0 standard drink    Types: 1 Glasses of wine per week    Comment: every night   . Drug use: No         OPHTHALMIC EXAM:  Base Eye Exam    Visual Acuity (ETDRS)      Right Left   Dist [place] 20/25 -2 20/20 -2       Tonometry (Tonopen, 9:00 AM)      Right Left   Pressure 13 13       Pupils      Dark Light Shape React APD   Right 3 2 Round Brisk None   Left 3 3 Irregular Minimal None       Visual Fields (Counting fingers)      Left Right    Full Full       Extraocular Movement      Right Left    Full Full       Neuro/Psych    Oriented x3: Yes   Mood/Affect: Normal       Dilation    Right eye: 1.0% Mydriacyl, 2.5% Phenylephrine @ 9:03 AM        Slit Lamp and Fundus Exam    External Exam      Right Left   External Normal Normal       Slit Lamp Exam       Right Left   Lids/Lashes Normal Normal   Conjunctiva/Sclera White and quiet White and quiet   Cornea Clear Clear   Anterior Chamber Deep and quiet Deep and quiet   Iris Round and reactive Round and reactive   Lens Posterior chamber intraocular lens Posterior chamber intraocular lens   Anterior Vitreous Normal Normal       Fundus Exam      Right Left   Posterior Vitreous Vitrectomized, clear    Disc Normal    C/D Ratio 0.3    Macula Less topo distortion    Vessels Normal    Periphery Normal           IMAGING AND PROCEDURES  Imaging and Procedures for 05/17/20  OCT, Retina - OU - Both Eyes       Right Eye Quality was good. Scan locations included subfoveal. Central Foveal Thickness: 362. Progression has improved.   Left Eye Quality was good. Scan locations included subfoveal. Central Foveal Thickness: 263. Progression has been stable. Findings include vitreomacular adhesion .   Notes This the second some mild 5 to 6 weeks status post vitrectomy membrane peel right eye for epiretinal membrane with continued anatomic improvement OD.                ASSESSMENT/PLAN:  Right epiretinal membrane Macular anatomy of the right eye continues to improve nicely coincident with visual acuity clarity both on examination objectively and by patient's report  Amaurosis fugax of right eye This condition abated last week on its own in a brief period of time nonetheless the monocular appearance of this type of vision change requires proximal vascular evaluation particular attention to the carotids for vascular disease.  I will asked the patient to promptly contact Dr. Carolann Littler who may arrange full examination in addition to carotid Doppler evaluation, echocardiogram looking for intracardiac lesions that could explain this.  Patient reports he is scheduled for an MRI at [facility] for evaluation of hearing loss on the left side of idiopathic origin.  While each of these might  represent proximal vascular disease, they would not be from the same lesion of the major neck vessels.  In the meantime patient should consider continue to take aspirin 325 mg daily. He reports using using using now 81 mg, twice daily, and I have suggested to  either take 4 of these a day or to take 1 325 mg tablet daily      ICD-10-CM   1. Right epiretinal membrane  H35.371 OCT, Retina - OU - Both Eyes  2. Amaurosis fugax of right eye  G45.3     1. I have asked the patient to promptly evaluate this condition with his primary care physician with via physical and further studies  2. I have asked the patient to to up his dose of aspirin to 325 mg once daily  3. I have asked the patient to proceed immediately to the emergency room should similar symptoms arise at any time  Ophthalmic Meds Ordered this visit:  No orders of the defined types were placed in this encounter.      Return in about 3 months (around 08/16/2020) for DILATE OU, OCT.  Patient Instructions  I have asked the patient to contact Dr. Darnell Level [name]'s office to arrange for complete examination, physical and evaluation of amaurosis fugax right eye to include carotid Doppler and echocardiogram and possibly MRA    Explained the diagnoses, plan, and follow up with the patient and they expressed understanding.  Patient expressed understanding of the importance of proper follow up care.   Clent Demark Adesuwa Osgood M.D. Diseases & Surgery of the Retina and Vitreous Retina & Diabetic [place] 05/17/20     Abbreviations: M myopia (nearsighted); A astigmatism; H hyperopia (farsighted); P presbyopia; Mrx spectacle prescription;  CTL contact lenses; OD right eye; OS left eye; OU both eyes  XT exotropia; ET esotropia; PEK punctate epithelial keratitis; PEE punctate epithelial erosions; DES dry eye syndrome; MGD meibomian gland dysfunction; ATs artificial tears; PFAT's preservative free artificial tears; [place] nuclear sclerotic cataract;  PSC posterior subcapsular cataract; ERM epi-retinal membrane; PVD posterior vitreous detachment; RD retinal detachment; DM diabetes mellitus; DR diabetic retinopathy; NPDR non-proliferative diabetic retinopathy; PDR proliferative diabetic retinopathy; CSME clinically significant macular edema; DME diabetic macular edema; dbh dot blot hemorrhages; CWS cotton wool spot; POAG primary open angle glaucoma; C/D cup-to-disc ratio; HVF humphrey visual field; GVF goldmann visual field; OCT optical coherence tomography; IOP intraocular pressure; BRVO Branch retinal vein occlusion; CRVO central retinal vein occlusion; CRAO central retinal artery occlusion; BRAO branch retinal artery occlusion; RT retinal tear; SB scleral buckle; PPV pars plana vitrectomy; VH Vitreous hemorrhage; PRP panretinal laser photocoagulation; IVK intravitreal kenalog; VMT vitreomacular traction; MH Macular hole;  NVD neovascularization of the disc; NVE neovascularization elsewhere; AREDS age related eye disease study; ARMD age related macular degeneration; POAG primary open angle glaucoma; EBMD epithelial/anterior basement membrane dystrophy; ACIOL anterior chamber intraocular lens; IOL intraocular lens; PCIOL posterior chamber intraocular lens; Phaco/IOL phacoemulsification with intraocular lens placement; [place] photorefractive keratectomy; LASIK laser assisted in situ keratomileusis; HTN hypertension; DM diabetes mellitus; COPD chronic obstructive pulmonary disease

## 2020-05-18 ENCOUNTER — Ambulatory Visit (INDEPENDENT_AMBULATORY_CARE_PROVIDER_SITE_OTHER): Payer: Medicare Other | Admitting: Family Medicine

## 2020-05-18 ENCOUNTER — Encounter: Payer: Self-pay | Admitting: Family Medicine

## 2020-05-18 VITALS — BP 102/62 | HR 71 | Temp 98.1°F | Ht 69.0 in | Wt 185.0 lb

## 2020-05-18 DIAGNOSIS — H53121 Transient visual loss, right eye: Secondary | ICD-10-CM

## 2020-05-18 DIAGNOSIS — I77810 Thoracic aortic ectasia: Secondary | ICD-10-CM | POA: Diagnosis not present

## 2020-05-18 NOTE — Progress Notes (Signed)
Established Patient Office Visit  Subjective:  Patient ID: Anthony Mendez, male    DOB: 07-Jan-1941  Age: 79 y.o. MRN: [id number]  CC:  Chief Complaint  Patient presents with  . Eye Problem    HPI Anthony Mendez presents for episode last Saturday when he was on his boat up in the [facility] of transient loss of vision right eye.  This lasted about 2 to 3 minutes and promptly improved.  He did not have any associated speech changes, headache, focal weakness, confusion, or any other neurologic concerns.  He saw his retina specialist yesterday and they had concerns regarding amaurosis fugax.  Patient was instructed to take 325 mg aspirin daily and follow-up promptly here.  Patient has had no further episodes whatsoever since then.  No prior history of TIA or CVA.  He does have history of hyperlipidemia.  No hypertension.  No diabetes history. Non-smoker.    He had chronic hearing loss and has had multiple ear surgeries including procedure at [facility] for chronic perforation of TM back in March.  He denies any history of atrial fibrillation.  No recent chest pains.  Past Medical History:  Diagnosis Date  . Essential hypertension 11/11/2016  . HEARING LOSS, BILATERAL 06/19/2009  . History of solar dermatitis   . Hyperlipidemia 11/11/2016    Past Surgical History:  Procedure Laterality Date  . EYE SURGERY Right 04/04/2020   Vitrectomy, [place]  . INNER EAR SURGERY    . TONSILLECTOMY      Family History  Problem Relation Age of Onset  . Stroke Mother   . Hypertension Mother     Social History   Socioeconomic History  . Marital status: Married    Spouse name: Not on file  . Number of children: Not on file  . Years of education: Not on file  . Highest education level: Not on file  Occupational History  . Not on file  Tobacco Use  . Smoking status: Never Smoker  . Smokeless tobacco: Never Used  Vaping Use  . Vaping Use: Never used  Substance and Sexual Activity  .  Alcohol use: Yes    Alcohol/week: 1.0 standard drink    Types: 1 Glasses of wine per week    Comment: every night   . Drug use: No  . Sexual activity: Not on file  Other Topics Concern  . Not on file  Social History Narrative  . Not on file   Social Determinants of Health   Financial Resource Strain:   . Difficulty of Paying Living Expenses: Not on file  Food Insecurity:   . Worried About Charity fundraiser in the Last Year: Not on file  . Ran Out of Food in the Last Year: Not on file  Transportation Needs:   . Lack of Transportation (Medical): Not on file  . Lack of Transportation (Non-Medical): Not on file  Physical Activity:   . Days of Exercise per Week: Not on file  . Minutes of Exercise per Session: Not on file  Stress:   . Feeling of Stress : Not on file  Social Connections:   . Frequency of Communication with Friends and Family: Not on file  . Frequency of Social Gatherings with Friends and Family: Not on file  . Attends Religious Services: Not on file  . Active Member of Clubs or Organizations: Not on file  . Attends Archivist Meetings: Not on file  . Marital Status: Not on file  Intimate Partner Violence:   . Fear of Current or Ex-Partner: Not on file  . Emotionally Abused: Not on file  . Physically Abused: Not on file  . Sexually Abused: Not on file    Outpatient Medications Prior to Visit  Medication Sig Dispense Refill  . aspirin 81 MG tablet Take 162 mg by mouth daily.     . ciprofloxacin (CIPRO) 500 MG tablet Take 500 mg by mouth 2 (two) times daily.    . ciprofloxacin-dexamethasone (CIPRODEX) OTIC suspension SHAKE LIQUID AND INSTILL 3 DROPS IN LEFT EAR THREE TIMES DAILY FOR 10 DAYS    . ciprofloxacin-dexamethasone (CIPRODEX) OTIC suspension SHAKE LIQUID AND INSTILL 4 DROPS IN BOTH EARS TWICE DAILY FOR 10 DAYS    . fluticasone (FLONASE) 50 MCG/ACT nasal spray     . neomycin-polymyxin-hydrocortisone (CORTISPORIN) 3.5-10000-1 OTIC suspension  INSTILL 4 TO 5 DROPS IN LEFT EAR BID PRF DRAINAGE    . ofloxacin (OCUFLOX) 0.3 % ophthalmic solution Place 1 drop into the right eye 4 (four) times daily. 5 mL 0  . prednisoLONE acetate (PRED FORTE) 1 % ophthalmic suspension Place 1 drop into the right eye 4 (four) times daily. 5 mL 0   No facility-administered medications prior to visit.    No Known Allergies  ROS Review of Systems  Constitutional: Negative for chills and fever.  Eyes: Positive for visual disturbance. Negative for pain.  Respiratory: Negative for cough and shortness of breath.   Cardiovascular: Negative for chest pain.  Neurological: Negative for dizziness, seizures, syncope, facial asymmetry, speech difficulty, weakness, numbness and headaches.  Psychiatric/Behavioral: Negative for confusion.      Objective:    Physical Exam Vitals reviewed.  Constitutional:      Appearance: Normal appearance.  Eyes:     Pupils: Pupils are equal, round, and reactive to light.  Neck:     Comments: No carotid bruits Cardiovascular:     Rate and Rhythm: Normal rate.     Comments: Occasional premature beat by auscultation Pulmonary:     Effort: Pulmonary effort is normal.     Breath sounds: Normal breath sounds.  Neurological:     General: No focal deficit present.     Mental Status: He is alert.     Cranial Nerves: No cranial nerve deficit.     Sensory: No sensory deficit.     Motor: No weakness.     Coordination: Coordination normal.     BP 102/62   Pulse 71   Temp 98.1 F (36.7 C) (Other (Comment))   Ht 5\' 9"  (1.753 m)   Wt 185 lb (83.9 kg)   SpO2 96%   BMI 27.32 kg/m  Wt Readings from Last 3 Encounters:  05/18/20 185 lb (83.9 kg)  10/04/19 184 lb 11.2 oz (83.8 kg)  03/22/19 183 lb 8 oz (83.2 kg)     Health Maintenance Due  Topic Date Due  . Hepatitis C Screening  Never done  . INFLUENZA VACCINE  Never done    There are no preventive care reminders to display for this patient.  Lab Results    Component Value Date   TSH 2.19 11/09/2017   Lab Results  Component Value Date   WBC 4.5 11/09/2017   HGB 14.6 11/09/2017   HCT 42.4 11/09/2017   MCV 100.6 (H) 11/09/2017   PLT 117.0 (L) 11/09/2017   Lab Results  Component Value Date   NA 140 11/09/2017   K 4.7 11/09/2017   CO2 30 11/09/2017   GLUCOSE  92 11/09/2017   BUN 15 11/09/2017   CREATININE 0.89 11/09/2017   BILITOT 0.9 11/09/2017   ALKPHOS 40 11/09/2017   AST 17 11/09/2017   ALT 22 11/09/2017   PROT 6.2 11/09/2017   ALBUMIN 3.7 11/09/2017   CALCIUM 9.3 11/09/2017   ANIONGAP 7 09/03/2016   GFR 88.06 11/09/2017   Lab Results  Component Value Date   CHOL 225 (H) 11/09/2017   Lab Results  Component Value Date   HDL 56.20 11/09/2017   Lab Results  Component Value Date   LDLCALC 144 (H) 11/09/2017   Lab Results  Component Value Date   TRIG 122.0 11/09/2017   Lab Results  Component Value Date   CHOLHDL 4 11/09/2017   No results found for: HGBA1C    Assessment & Plan:   Problem List Items Addressed This Visit    None    Visit Diagnoses    Transient visual loss of right eye    -  Primary   Relevant Orders   Sedimentation rate   EKG 12-Lead    Patient had episode as above transient vision loss right eye last Saturday with no episodes since then.  Episode lasted about 3 minutes.  Clinically, this does not sound like likely temporal arteritis or vasculitis type process.  We explained embolic processes need to be ruled out.  -We will check EKG= shows NSR with no acute changes. -Check sedimentation rate -Set up carotid Dopplers -Echocardiogram -Aspirin 325 mg daily -Consider follow-up fasting lipids soon -Follow-up immediately for any recurrent symptoms -He already has an MRI of the brain scheduled at [facility] for early October. -He is to follow-up immediately for any recurrent visual difficulties, slurred speech, focal weakness, or any other neurologic concerns -He had questions about flying next  week.  We explained in the setting of any frequent or crescendo TIA type symptoms would definitely not recommend flying.  We also explained in the setting of things like stroke we definitely recommend a period of recovery (eg 4 weeks) before flying  No orders of the defined types were placed in this encounter.   Follow-up: No follow-ups on file.    Carolann Littler, MD

## 2020-05-18 NOTE — Patient Instructions (Addendum)
Amaurosis Fugax Amaurosis fugax, also called transient visual loss, is a condition in which a person loses sight in one eye or, rarely, both eyes for a short time. The vision loss in the affected eye may be total or partial. The vision loss usually lasts for only a few seconds or minutes before sight returns to normal. In some cases, vision loss may last for several hours. This condition is typically caused by interruption of blood flow in the artery that supplies blood to the retina. The retina is the part of the eye that contains the nerves needed for sight. This condition can be a warning sign that a stroke may happen, either in the eye or in the brain. A stroke can result in permanent vision loss or loss of other body functions. What are the causes? This condition is caused by a loss or interruption of blood flow to the retinal artery. Causes for the change in blood flow include:  A buildup of cholesterol and fats, or plaque, in the artery (atherosclerosis). If any plaque breaks off and gets into the bloodstream, it can travel to other blood vessels, such as the retinal artery.  Diseases of the heart valves.  Certain blood conditions, such as sickle cell anemia, leukemia, and blood clotting (coagulation) disorders.  Inflammation of the arteries (vasculitis).  A fast or irregular heartbeat, such as atrial fibrillation.  Family history of stroke. What increases the risk? The following factors may make you more likely to develop this condition:  Use of any tobacco products, including cigarettes, chewing tobacco, or electronic cigarettes.  Poorly controlled diabetes.  Conditions that can lead to diseases of the heart and blood vessels (cardiovascular diseases), such as: ? High blood pressure (hypertension). ? High cholesterol.  Drinking too much alcohol regularly.  Use of drugs, especially cocaine.  Age. The risk increases with age. What are the signs or symptoms? The main symptom  of this condition is painless, sudden loss of vision in one eye. The vision loss often starts at the top and moves down, as if a curtain is being pulled down over your eye. This is usually followed by a quick return of vision. However, symptoms may last for several hours. It is important to seek medical care right away even if your symptoms go away. How is this diagnosed? This condition is diagnosed by:  Medical history and physical exam.  Eye exam, including dilating drops and looking at the back of your eyes.  Carotid ultrasound. This checks for plaque in the carotid arteries in your neck.  Magnetic resonance angiography (MRA). This checks the carotid artery and the branches that supply the brain. MRA looks for areas of blockage or disease. You may also have other tests, including:  Blood tests.  Electrocardiogram (ECG) to check your heart rhythm.  Echocardiogram (ECHO) to check your heart function. How is this treated? Emergency treatment for this condition may involve massaging the eyeball or using certain breathing techniques to remove or ease the blockage of the retinal artery. You may also get an emergency referral to a clinic or medical center that treats strokes. Other treatments focus on reducing your risk of having a stroke in the future. These may include:  Medicines, such as medicines to manage high blood pressure, diabetes, or cholesterol, or to thin the blood.  Surgical procedures, such as: ? Carotid endarterectomy to remove plaque from the carotid artery. ? Carotid angioplasty and placement of a small, mesh tube (stenting) to open the blocked part of  the artery.  Lifestyle changes, such as stopping tobacco use, changing your diet, and getting enough exercise. Follow these instructions at home: Medicines  Take over-the-counter and prescription medicines only as told by your health care provider.  If you are taking blood thinners: ? Talk with your health care provider  before you take any medicines that contain aspirin or NSAIDs, such as ibuprofen. These medicines increase your risk for dangerous bleeding. ? Take your medicine exactly as told, at the same time every day. ? Avoid activities that could cause injury or bruising, and follow instructions about how to prevent falls. ? Wear a medical alert bracelet or carry a card that lists what medicines you take. Eating and drinking   Eat a diet that includes five or more servings of fruits and vegetables each day. This may reduce the risk of stroke.  Certain diets may help with high blood pressure, high cholesterol, diabetes, or obesity. These include: ? A diet that is low in salt (sodium) to manage high blood pressure. ? A high-fiber diet that is low in saturated fat, trans fat, and cholesterol to control cholesterol levels. ? A low-carbohydrate, low-sugar diet to manage diabetes. ? A reduced-calorie diet that is low in sodium, saturated fat, trans fat, and cholesterol to manage obesity. Lifestyle   Maintain a healthy weight.  Stay physically active. Try to get at least 30 minutes of activity on most or all days.  Do not use any products that contain nicotine or tobacco, such as cigarettes, e-cigarettes, and chewing tobacco. If you need help quitting, ask your health care provider.  Do not misuse drugs. General instructions  Do not drink alcohol if: ? Your health care provider tells you not to drink. ? You are pregnant, may be pregnant, or are planning to become pregnant.  If you drink alcohol: ? Limit how much you use to:  0-1 drink a day for women.  0-2 drinks a day for men. ? Be aware of how much alcohol is in your drink. In the [place] one drink equals one 12 oz bottle of beer (355 mL), one 5 oz glass of wine (148 mL), or one 1 oz glass of hard liquor (44 mL).  Keep all follow-up visits as told by your health care provider. This is important. Contact a health care provider if:  You lose  vision in one eye or both eyes. Get help right away if:   You have chest pain or an irregular heartbeat.  You have any symptoms of a stroke. "BE FAST" is an easy way to remember the main warning signs of a stroke. ? B - Balance. Signs are dizziness, sudden trouble walking, or loss of balance. ? E - Eyes. Signs are trouble seeing or a sudden change in vision. ? F - Face. Signs are sudden weakness or numbness of the face, or the face or eyelid drooping on one side. ? A - Arms. Signs are weakness or numbness in an arm. This happens suddenly and usually on one side of the body. ? S - Speech. Signs are sudden trouble speaking, slurred speech, or trouble understanding what people say. ? T - Time. Time to call emergency services. Write down what time symptoms started.  You have other signs of a stroke, such as: ? A sudden, severe headache with no known cause. ? Nausea or vomiting. ? Seizure. These symptoms may represent a serious problem that is an emergency. Do not wait to see if the symptoms will go away.  Get medical help right away. Call your local emergency services (911 in the [place] Do not drive yourself to the hospital. Summary  Amaurosis fugax is a condition in which you lose sight for a short time.  This condition can be a warning sign that a stroke may happen. A stroke can result in permanent vision loss or loss of other body functions.  Seek medical care right away even if your symptoms go away. This information is not intended to replace advice given to you by your health care provider. Make sure you discuss any questions you have with your health care provider. Document Revised: 03/09/2019 Document Reviewed: 03/09/2019 Elsevier Patient Education  [place] with aspirin 325 mg daily  Follow up immediately for any recurrent loss of vision or other concerning symptoms  Will be setting up carotid doppler and echocardiogram to further assess.

## 2020-05-19 LAB — SEDIMENTATION RATE: Sed Rate: 2 mm/h (ref 0–20)

## 2020-05-21 ENCOUNTER — Telehealth: Payer: Self-pay | Admitting: Family Medicine

## 2020-05-21 NOTE — Telephone Encounter (Signed)
Pt is calling back for results. Please call back when possible at [phone]

## 2020-05-24 ENCOUNTER — Encounter (INDEPENDENT_AMBULATORY_CARE_PROVIDER_SITE_OTHER): Payer: Medicare Other | Admitting: Ophthalmology

## 2020-05-28 NOTE — Telephone Encounter (Signed)
This has been taking care of. See result notes.  ?

## 2020-05-31 ENCOUNTER — Ambulatory Visit (HOSPITAL_COMMUNITY)
Admission: RE | Admit: 2020-05-31 | Discharge: 2020-05-31 | Disposition: A | Discharge status: Home / Self Care | Payer: Medicare Other | Source: Ambulatory Visit | Attending: Cardiology | Admitting: Cardiology

## 2020-05-31 ENCOUNTER — Other Ambulatory Visit: Payer: Self-pay

## 2020-05-31 DIAGNOSIS — H53121 Transient visual loss, right eye: Secondary | ICD-10-CM

## 2020-06-01 ENCOUNTER — Other Ambulatory Visit (HOSPITAL_COMMUNITY): Payer: Medicare Other

## 2020-06-14 ENCOUNTER — Other Ambulatory Visit: Payer: Self-pay

## 2020-06-14 ENCOUNTER — Ambulatory Visit (HOSPITAL_COMMUNITY): Payer: Medicare Other | Attending: Internal Medicine

## 2020-06-14 DIAGNOSIS — I517 Cardiomegaly: Secondary | ICD-10-CM | POA: Diagnosis not present

## 2020-06-14 DIAGNOSIS — R002 Palpitations: Secondary | ICD-10-CM | POA: Insufficient documentation

## 2020-06-14 DIAGNOSIS — G459 Transient cerebral ischemic attack, unspecified: Secondary | ICD-10-CM | POA: Diagnosis not present

## 2020-06-14 DIAGNOSIS — H53121 Transient visual loss, right eye: Secondary | ICD-10-CM | POA: Diagnosis not present

## 2020-06-14 DIAGNOSIS — I10 Essential (primary) hypertension: Secondary | ICD-10-CM | POA: Diagnosis not present

## 2020-06-14 DIAGNOSIS — I350 Nonrheumatic aortic (valve) stenosis: Secondary | ICD-10-CM

## 2020-06-14 DIAGNOSIS — I351 Nonrheumatic aortic (valve) insufficiency: Secondary | ICD-10-CM | POA: Insufficient documentation

## 2020-06-14 DIAGNOSIS — E785 Hyperlipidemia, unspecified: Secondary | ICD-10-CM | POA: Diagnosis not present

## 2020-06-14 LAB — ECHOCARDIOGRAM COMPLETE
Area-P 1/2: 2.79 cm2
P 1/2 time: 483 msec
S' Lateral: 2.7 cm

## 2020-06-14 NOTE — Progress Notes (Signed)
Patient ID: Anthony Mendez, male   DOB: [birth year], 79 y.o.   MRN: [id number]   Echo Findings presented to Dr. Irish Lack (DOD) prior to patient leaving testing area.  [name] is stable and has no complaints during visit today.  Dr. Irish Lack realeased patient to follow up with Dr. [name] for results and further evaluation as needed.    Deliah Boston, RDCS

## 2020-06-15 NOTE — Addendum Note (Signed)
Addended by: [name] on: 06/15/2020 12:22 PM   Modules accepted: Orders

## 2020-06-20 ENCOUNTER — Telehealth: Payer: Self-pay | Admitting: Radiology

## 2020-06-20 ENCOUNTER — Telehealth: Payer: Self-pay | Admitting: Family Medicine

## 2020-06-20 ENCOUNTER — Other Ambulatory Visit: Payer: Self-pay

## 2020-06-20 ENCOUNTER — Encounter: Payer: Self-pay | Admitting: Internal Medicine

## 2020-06-20 ENCOUNTER — Ambulatory Visit (INDEPENDENT_AMBULATORY_CARE_PROVIDER_SITE_OTHER): Payer: Medicare Other | Admitting: Internal Medicine

## 2020-06-20 VITALS — BP 164/66 | HR 67 | Ht 70.5 in | Wt 185.2 lb

## 2020-06-20 DIAGNOSIS — I77810 Thoracic aortic ectasia: Secondary | ICD-10-CM

## 2020-06-20 DIAGNOSIS — E785 Hyperlipidemia, unspecified: Secondary | ICD-10-CM

## 2020-06-20 DIAGNOSIS — I10 Essential (primary) hypertension: Secondary | ICD-10-CM | POA: Diagnosis not present

## 2020-06-20 DIAGNOSIS — G453 Amaurosis fugax: Secondary | ICD-10-CM | POA: Diagnosis not present

## 2020-06-20 DIAGNOSIS — I77819 Aortic ectasia, unspecified site: Secondary | ICD-10-CM | POA: Diagnosis not present

## 2020-06-20 DIAGNOSIS — I499 Cardiac arrhythmia, unspecified: Secondary | ICD-10-CM | POA: Diagnosis not present

## 2020-06-20 DIAGNOSIS — R002 Palpitations: Secondary | ICD-10-CM

## 2020-06-20 MED ORDER — LOSARTAN POTASSIUM 25 MG PO TABS: 25.0000 mg | ORAL_TABLET | Freq: Every day | ORAL | 3 refills | Status: DC

## 2020-06-20 NOTE — Patient Instructions (Signed)
Medication Instructions:  START: LOSARTAN 25mg  DAILY  *If you need a refill on your cardiac medications before your next appointment, please call your pharmacy*  Lab Work: BMET- one week prior to CT.  If you have labs (blood work) drawn today and your tests are completely normal, you will receive your results only by:  [place] (if you have MyChart) OR  A paper copy in the mail If you have any lab test that is abnormal or we need to change your treatment, we will call you to review the results.  Testing/Procedures: CT of CHEST, ABDOMEN AND PELVIS-  This will take place at [address]   [employer] Cardiac Event Monitor Instructions Your physician has requested you wear your cardiac event monitor for 30 days, (1-30). [employer] may call or text to confirm a shipping address. The monitor will be sent to a land address via UPS. [employer] will not ship a monitor to a PO BOX. It typically takes 3-5 days to receive your monitor after it has been enrolled. [employer] will assist with USPS tracking if your package is delayed. The telephone number for [employer] is [phone]. Once you have received your monitor, please review the enclosed instructions. Instruction tutorials can also be viewed under help and settings on the enclosed cell phone. Your monitor has already been registered assigning a specific monitor serial # to you.  Applying the monitor Remove cell phone from case and turn it on. The cell phone works as Dealer and needs to be within [employer] of you at all times. The cell phone will need to be charged on a daily basis. We recommend you plug the cell phone into the enclosed charger at your bedside table every night.  Monitor batteries: You will receive two monitor batteries labelled #1 and #2. These are your recorders. Plug battery #2 onto the second connection on the enclosed charger. Keep one battery on the charger at all times. This will  keep the monitor battery deactivated. It will also keep it fully charged for when you need to switch your monitor batteries. A small light will be blinking on the battery emblem when it is charging. The light on the battery emblem will remain on when the battery is fully charged.  Open package of a Monitor strip. Insert battery #1 into black hood on strip and gently squeeze monitor battery onto connection as indicated in instruction booklet. Set aside while preparing skin.  Choose location for your strip, vertical or horizontal, as indicated in the instruction booklet. Shave to remove all hair from location. There cannot be any lotions, oils, powders, or colognes on skin where monitor is to be applied. Wipe skin clean with enclosed Saline wipe. Dry skin completely.  Peel paper labeled #1 off the back of the Monitor strip exposing the adhesive. Place the monitor on the chest in the vertical or horizontal position shown in the instruction booklet. One arrow on the monitor strip must be pointing upward. Carefully remove paper labeled #2, attaching remainder of strip to your skin. Try not to create any folds or wrinkles in the strip as you apply it.  Firmly press and release the circle in the center of the monitor battery. You will hear a small beep. This is turning the monitor battery on. The heart emblem on the monitor battery will light up every 5 seconds if the monitor battery in turned on and connected to the patient securely. Do not push and hold the circle  down as this turns the monitor battery off. The cell phone will locate the monitor battery. A screen will appear on the cell phone checking the connection of your monitor strip. This may read poor connection initially but change to good connection within the next minute. Once your monitor accepts the connection you will hear a series of 3 beeps followed by a climbing crescendo of beeps. A screen will appear on the cell phone showing the  two monitor strip placement options. Touch the picture that demonstrates where you applied the monitor strip.  Your monitor strip and battery are waterproof. You are able to shower, bathe, or swim with the monitor on. They just ask you do not submerge deeper than 3 feet underwater. We recommend removing the monitor if you are swimming in a lake, river, or ocean.  Your monitor battery will need to be switched to a fully charged monitor battery approximately once a week. The cell phone will alert you of an action which needs to be made.  On the cell phone, tap for details to reveal connection status, monitor battery status, and cell phone battery status. The green dots indicates your monitor is in good status. A red dot indicates there is something that needs your attention.  To record a symptom, click the circle on the monitor battery. In 30-60 seconds a list of symptoms will appear on the cell phone. Select your symptom and tap save. Your monitor will record a sustained or significant arrhythmia regardless of you clicking the button. Some patients do not feel the heart rhythm irregularities. [employer] will notify us of any serious or critical events.  Refer to instruction booklet for instructions on switching batteries, changing strips, the Do not disturb or Pause features, or any additional questions.  Call [employer] at [phone], to confirm your monitor is transmitting and record your baseline. They will answer any questions you may have regarding the monitor instructions at that time.  Returning the monitor to [place] all equipment back into blue box. Peel off strip of paper to expose adhesive and close box securely. There is a prepaid UPS shipping label on this box. Drop in a UPS drop box, or at a UPS facility like [employer]. You may also contact [employer] to arrange UPS to pick up monitor package at your home.  Follow-Up: At [facility], you and your health needs  are our priority.  As part of our continuing mission to provide you with exceptional heart care, we have created designated Provider Care Teams.  These Care Teams include your primary Cardiologist (physician) and Advanced Practice Providers (APPs -  Physician Assistants and Nurse Practitioners) who all work together to provide you with the care you need, when you need it.  We recommend signing up for the patient portal called "MyChart".  Sign up information is provided on this After Visit Summary.  MyChart is used to connect with patients for Virtual Visits (Telemedicine).  Patients are able to view lab/test results, encounter notes, upcoming appointments, etc.  Non-urgent messages can be sent to your provider as well.   To learn more about what you can do with MyChart, go to [email or web address].    Your next appointment:   6 week(s)  The format for your next appointment:   In Person  Provider:   Cherlynn Kaiser, MD

## 2020-06-20 NOTE — Progress Notes (Signed)
Cardiology Office Note:    Date:  06/20/2020   ID:  Anthony Mendez, DOB 12/26/1940, MRN [id number]  PCP:  [name], MD  Cardiologist:  No primary care provider on file.  Electrophysiologist:  None   Referring MD: [name], MD   Chief Complaint/Reason for Referral: TAA, aortic valve regurgitation  History of Present Illness:    Anthony Mendez is a 79 y.o. male with a history of HTN and HLD who presents for incidentally noted thoracic aortic aneurysm and moderate aortic valve regurgitation on echocardiogram obtained for an episode of amaurosis fugax felt to represent TIA.   He denies chest pain or SOB. Overall remains active and has a boat on the [facility] when the episode of vision loss occurred. He has occasional, non bothersome palpitations. No recurrence of symptoms since that time. We reviewed in detail cardiac etiologies of TIA and stroke including atrial fibrillation and aortic atherosclerosis. No valvular lesions noted on echo however we discussed role for TEE if other evaluation is unremarkable. We will consider TEE if needed.   We also reviewed natural history of TAA and most likely etiologies. He has a history of hypertension. It does not appear that he is on treatment for hypertension currently.  No definite family history of aneurysm or rupture. No definite features of connective tissue disorder or Marfan's syndrome by exam.   We also reviewed that aortic regurgitation in setting of aortic dilation can be followed, no definite aortic valve pathology as a cause.   Above issues reviewed in great detail with patient and wife who is present for the visit today and provides additional history.   The patient denies chest pain, chest pressure, dyspnea at rest or with exertion, PND, orthopnea, or leg swelling. Denies cough, fever, chills. Denies nausea, vomiting. Denies syncope or presyncope. Denies dizziness or lightheadedness.  Past Medical History:    Diagnosis Date  . Essential hypertension 11/11/2016  . HEARING LOSS, BILATERAL 06/19/2009  . History of solar dermatitis   . Hyperlipidemia 11/11/2016    Past Surgical History:  Procedure Laterality Date  . EYE SURGERY Right 04/04/2020   Vitrectomy, Membrane Peel  . INNER EAR SURGERY    . TONSILLECTOMY      Current Medications: Current Meds  Medication Sig  . aspirin 81 MG tablet Take 162 mg by mouth daily.   . ciprofloxacin (CIPRO) 500 MG tablet Take 500 mg by mouth 2 (two) times daily.  . ciprofloxacin-dexamethasone (CIPRODEX) OTIC suspension SHAKE LIQUID AND INSTILL 3 DROPS IN LEFT EAR THREE TIMES DAILY FOR 10 DAYS  . ciprofloxacin-dexamethasone (CIPRODEX) OTIC suspension SHAKE LIQUID AND INSTILL 4 DROPS IN BOTH EARS TWICE DAILY FOR 10 DAYS  . fluticasone (FLONASE) 50 MCG/ACT nasal spray   . neomycin-polymyxin-hydrocortisone (CORTISPORIN) 3.5-10000-1 OTIC suspension INSTILL 4 TO 5 DROPS IN LEFT EAR BID PRF DRAINAGE  . ofloxacin (OCUFLOX) 0.3 % ophthalmic solution Place 1 drop into the right eye 4 (four) times daily.  . prednisoLONE acetate (PRED FORTE) 1 % ophthalmic suspension Place 1 drop into the right eye 4 (four) times daily.     Allergies:   Patient has no known allergies.   Social History   Tobacco Use  . Smoking status: Never Smoker  . Smokeless tobacco: Never Used  Vaping Use  . Vaping Use: Never used  Substance Use Topics  . Alcohol use: Yes    Alcohol/week: 1.0 standard drink    Types: 1 Glasses of wine per week  Comment: every night   . Drug use: No     Family History: The patient's family history includes Hypertension in his mother; Stroke in his mother.  ROS:   Please see the history of present illness.    All other systems reviewed and are negative.  EKGs/Labs/Other Studies Reviewed:    The following studies were reviewed today:  EKG:  NSR, sinus arrhythmia, LAD, iRBBB  Recent Labs: No results found for requested labs within last 8760  hours.  Recent Lipid Panel    Component Value Date/Time   CHOL 225 (H) 11/09/2017 0943   TRIG 122.0 11/09/2017 0943   HDL 56.20 11/09/2017 0943   CHOLHDL 4 11/09/2017 0943   VLDL 24.4 11/09/2017 0943   LDLCALC 144 (H) 11/09/2017 0943   LDLDIRECT 145.9 07/25/2014 1029    Physical Exam:    VS:  BP (!) 164/66   Pulse 67   Ht 5' 10.5" (1.791 m)   Wt 185 lb 3.2 oz (84 kg)   SpO2 97%   BMI 26.20 kg/m     Wt Readings from Last 5 Encounters:  06/20/20 185 lb 3.2 oz (84 kg)  05/18/20 185 lb (83.9 kg)  10/04/19 184 lb 11.2 oz (83.8 kg)  03/22/19 183 lb 8 oz (83.2 kg)  03/22/18 192 lb (87.1 kg)    Constitutional: No acute distress Eyes: sclera non-icteric, normal conjunctiva and lids ENMT: normal dentition, moist mucous membranes Cardiovascular: regular rhythm, normal rate, no murmurs. S1 and S2 normal. Radial pulses normal bilaterally. No jugular venous distention.  Respiratory: clear to auscultation bilaterally GI : normal bowel sounds, soft and nontender. No distention.   MSK: extremities warm, well perfused. No edema.  NEURO: grossly nonfocal exam, moves all extremities. PSYCH: alert and oriented x 3, normal mood and affect.   ASSESSMENT:    1. Ascending aorta dilatation (HCC)   2. Acquired dilation of ascending aorta and aortic root ([place])   3. Essential hypertension   4. Amaurosis fugax of right eye   5. Irregular heart rate   6. Palpitations   7. Hyperlipidemia, unspecified hyperlipidemia type    PLAN:    Ascending aorta dilatation (HCC) Acquired dilation of ascending aorta and aortic root (HCC) Essential hypertension  TAA - Mid Asc Ao 49 mm, SoV 40 mm by Echo/CT pending  - Plan: losartan (COZAAR) 25 MG tablet - Plan: CT CHEST ABDOMEN PELVIS W CONTRAST - incidentally noted aorta dilation. He canceled his appt with surgeon due to feeling this was premature. I recommended that he keep this appointment given degree of dilation. This will be helpful to establish  care now and determine timing for possible aorta replacement, particularly with aortic valve regurgitation.  - BP uncontrolled today, likely a contributor. Start losartan 25 mg daily.  Amaurosis fugax of right eye Irregular heart rate Palpitations   - Plan: CARDIAC EVENT MONITOR - need to screen for Afib.  - Mild biatrial dilation on echo.  - will consider TEE if monitor unremarkable. - discussed Afib in detail including stroke prevention if afib is detected. - ASA 81 mg daily can be continued.  Hyperlipidemia, unspecified hyperlipidemia type - not on lipid lowering agents, no recent lipids. Repeat at follow up.   Total time of encounter: 60 minutes total time of encounter, including 45 minutes spent in face-to-face patient care on the date of this encounter. This time includes coordination of care and counseling regarding above mentioned problem list. Remainder of non-face-to-face time involved reviewing chart documents/testing relevant to  the patient encounter and documentation in the medical record. I have independently reviewed documentation from referring provider.   Cherlynn Kaiser, MD [place]  [facility]    Medication Adjustments/Labs and Tests Ordered: Current medicines are reviewed at length with the patient today.  Concerns regarding medicines are outlined above.   Orders Placed This Encounter  Procedures  . CT CHEST ABDOMEN PELVIS W CONTRAST  . Basic metabolic panel  . CARDIAC EVENT MONITOR  . EKG 12-Lead    Meds ordered this encounter  Medications  . losartan (COZAAR) 25 MG tablet    Sig: Take 1 tablet (25 mg total) by mouth daily.    Dispense:  90 tablet    Refill:  3    Patient Instructions  Medication Instructions:  START: LOSARTAN 25mg  DAILY  *If you need a refill on your cardiac medications before your next appointment, please call your pharmacy*  Lab Work: BMET- one week prior to CT.  If you have labs (blood work) drawn today and your tests  are completely normal, you will receive your results only by: Marland Kitchen MyChart Message (if you have MyChart) OR . A paper copy in the mail If you have any lab test that is abnormal or we need to change your treatment, we will call you to review the results.  Testing/Procedures: CT of CHEST, ABDOMEN AND PELVIS-  This will take place at [address]   [employer] Cardiac Event Monitor Instructions Your physician has requested you wear your cardiac event monitor for 30 days, (1-30). [employer] may call or text to confirm a shipping address. The monitor will be sent to a land address via UPS. [employer] will not ship a monitor to a PO BOX. It typically takes 3-5 days to receive your monitor after it has been enrolled. [employer] will assist with USPS tracking if your package is delayed. The telephone number for [employer] is [phone]. Once you have received your monitor, please review the enclosed instructions. Instruction tutorials can also be viewed under help and settings on the enclosed cell phone. Your monitor has already been registered assigning a specific monitor serial # to you.  Applying the monitor Remove cell phone from case and turn it on. The cell phone works as Dealer and needs to be within [employer] of you at all times. The cell phone will need to be charged on a daily basis. We recommend you plug the cell phone into the enclosed charger at your bedside table every night.  Monitor batteries: You will receive two monitor batteries labelled #1 and #2. These are your recorders. Plug battery #2 onto the second connection on the enclosed charger. Keep one battery on the charger at all times. This will keep the monitor battery deactivated. It will also keep it fully charged for when you need to switch your monitor batteries. A small light will be blinking on the battery emblem when it is charging. The light on the battery emblem will remain on when the battery  is fully charged.  Open package of a Monitor strip. Insert battery #1 into black hood on strip and gently squeeze monitor battery onto connection as indicated in instruction booklet. Set aside while preparing skin.  Choose location for your strip, vertical or horizontal, as indicated in the instruction booklet. Shave to remove all hair from location. There cannot be any lotions, oils, powders, or colognes on skin where monitor is to be applied. Wipe skin clean with enclosed Saline wipe. Dry  skin completely.  Peel paper labeled #1 off the back of the Monitor strip exposing the adhesive. Place the monitor on the chest in the vertical or horizontal position shown in the instruction booklet. One arrow on the monitor strip must be pointing upward. Carefully remove paper labeled #2, attaching remainder of strip to your skin. Try not to create any folds or wrinkles in the strip as you apply it.  Firmly press and release the circle in the center of the monitor battery. You will hear a small beep. This is turning the monitor battery on. The heart emblem on the monitor battery will light up every 5 seconds if the monitor battery in turned on and connected to the patient securely. Do not push and hold the circle down as this turns the monitor battery off. The cell phone will locate the monitor battery. A screen will appear on the cell phone checking the connection of your monitor strip. This may read poor connection initially but change to good connection within the next minute. Once your monitor accepts the connection you will hear a series of 3 beeps followed by a climbing crescendo of beeps. A screen will appear on the cell phone showing the two monitor strip placement options. Touch the picture that demonstrates where you applied the monitor strip.  Your monitor strip and battery are waterproof. You are able to shower, bathe, or swim with the monitor on. They just ask you do not submerge deeper than  3 feet underwater. We recommend removing the monitor if you are swimming in a lake, river, or ocean.  Your monitor battery will need to be switched to a fully charged monitor battery approximately once a week. The cell phone will alert you of an action which needs to be made.  On the cell phone, tap for details to reveal connection status, monitor battery status, and cell phone battery status. The green dots indicates your monitor is in good status. A red dot indicates there is something that needs your attention.  To record a symptom, click the circle on the monitor battery. In 30-60 seconds a list of symptoms will appear on the cell phone. Select your symptom and tap save. Your monitor will record a sustained or significant arrhythmia regardless of you clicking the button. Some patients do not feel the heart rhythm irregularities. [employer] will notify us of any serious or critical events.  Refer to instruction booklet for instructions on switching batteries, changing strips, the Do not disturb or Pause features, or any additional questions.  Call [employer] at [phone], to confirm your monitor is transmitting and record your baseline. They will answer any questions you may have regarding the monitor instructions at that time.  Returning the monitor to [place] all equipment back into blue box. Peel off strip of paper to expose adhesive and close box securely. There is a prepaid UPS shipping label on this box. Drop in a UPS drop box, or at a UPS facility like [employer]. You may also contact [employer] to arrange UPS to pick up monitor package at your home.  Follow-Up: At [facility], you and your health needs are our priority.  As part of our continuing mission to provide you with exceptional heart care, we have created designated Provider Care Teams.  These Care Teams include your primary Cardiologist (physician) and Advanced Practice Providers (APPs -  Physician  Assistants and Nurse Practitioners) who all work together to provide you with the care you need, when you need it.  We recommend signing up for the patient portal called "MyChart".  Sign up information is provided on this After Visit Summary.  MyChart is used to connect with patients for Virtual Visits (Telemedicine).  Patients are able to view lab/test results, encounter notes, upcoming appointments, etc.  Non-urgent messages can be sent to your provider as well.   To learn more about what you can do with MyChart, go to [email or web address].    Your next appointment:   6 week(s)  The format for your next appointment:   In Person  Provider:   Cherlynn Kaiser, MD

## 2020-06-20 NOTE — Telephone Encounter (Signed)
Enrolled patient for a 30 day [employer] Event Monitor to be mailed to patients home  

## 2020-06-20 NOTE — Telephone Encounter (Signed)
Left message for patient to schedule Annual Wellness Visit.  Please schedule with Nurse Health Advisor Shannon Crews, RN at [facility]  

## 2020-06-21 ENCOUNTER — Encounter: Payer: Medicare Other | Admitting: Surgery

## 2020-06-21 DIAGNOSIS — H748X2 Other specified disorders of left middle ear and mastoid: Secondary | ICD-10-CM | POA: Diagnosis not present

## 2020-06-21 DIAGNOSIS — H903 Sensorineural hearing loss, bilateral: Secondary | ICD-10-CM | POA: Diagnosis not present

## 2020-06-26 DIAGNOSIS — H7292 Unspecified perforation of tympanic membrane, left ear: Secondary | ICD-10-CM | POA: Diagnosis not present

## 2020-06-26 DIAGNOSIS — H90A31 Mixed conductive and sensorineural hearing loss, unilateral, right ear with restricted hearing on the contralateral side: Secondary | ICD-10-CM | POA: Diagnosis not present

## 2020-06-26 DIAGNOSIS — H90A32 Mixed conductive and sensorineural hearing loss, unilateral, left ear with restricted hearing on the contralateral side: Secondary | ICD-10-CM | POA: Diagnosis not present

## 2020-06-27 ENCOUNTER — Other Ambulatory Visit: Payer: Self-pay | Admitting: Family Medicine

## 2020-07-17 ENCOUNTER — Ambulatory Visit: Payer: Medicare Other | Admitting: Internal Medicine

## 2020-07-19 ENCOUNTER — Other Ambulatory Visit: Payer: Medicare Other

## 2020-07-20 DIAGNOSIS — I77810 Thoracic aortic ectasia: Secondary | ICD-10-CM | POA: Diagnosis not present

## 2020-07-20 LAB — BASIC METABOLIC PANEL
BUN/Creatinine Ratio: 11 (ref 10–24)
BUN: 10 mg/dL (ref 8–27)
CO2: 23 mmol/L (ref 20–29)
Calcium: 9.4 mg/dL (ref 8.6–10.2)
Chloride: 104 mmol/L (ref 96–106)
Creatinine, Ser: 0.89 mg/dL (ref 0.76–1.27)
GFR calc Af Amer: 94 mL/min/{1.73_m2} (ref >59)
GFR calc non Af Amer: 81 mL/min/{1.73_m2} (ref >59)
Glucose: 101 mg/dL — ABNORMAL HIGH (ref 65–99)
Potassium: 4.6 mmol/L (ref 3.5–5.2)
Sodium: 140 mmol/L (ref 134–144)

## 2020-07-24 ENCOUNTER — Telehealth: Payer: Medicare Other | Admitting: Internal Medicine

## 2020-07-25 ENCOUNTER — Other Ambulatory Visit: Payer: Self-pay | Admitting: Internal Medicine

## 2020-07-25 DIAGNOSIS — I77810 Thoracic aortic ectasia: Secondary | ICD-10-CM

## 2020-07-26 ENCOUNTER — Ambulatory Visit (INDEPENDENT_AMBULATORY_CARE_PROVIDER_SITE_OTHER)
Admission: RE | Admit: 2020-07-26 | Discharge: 2020-07-26 | Disposition: A | Discharge status: Home / Self Care | Payer: Medicare Other | Source: Ambulatory Visit | Attending: Internal Medicine | Admitting: Internal Medicine

## 2020-07-26 ENCOUNTER — Other Ambulatory Visit: Payer: Self-pay

## 2020-07-26 DIAGNOSIS — J984 Other disorders of lung: Secondary | ICD-10-CM | POA: Diagnosis not present

## 2020-07-26 DIAGNOSIS — I77810 Thoracic aortic ectasia: Secondary | ICD-10-CM | POA: Diagnosis not present

## 2020-07-26 DIAGNOSIS — R911 Solitary pulmonary nodule: Secondary | ICD-10-CM | POA: Diagnosis not present

## 2020-07-26 DIAGNOSIS — N281 Cyst of kidney, acquired: Secondary | ICD-10-CM | POA: Diagnosis not present

## 2020-07-26 DIAGNOSIS — I723 Aneurysm of iliac artery: Secondary | ICD-10-CM | POA: Diagnosis not present

## 2020-07-26 DIAGNOSIS — I712 Thoracic aortic aneurysm, without rupture: Secondary | ICD-10-CM | POA: Diagnosis not present

## 2020-07-26 DIAGNOSIS — K4090 Unilateral inguinal hernia, without obstruction or gangrene, not specified as recurrent: Secondary | ICD-10-CM | POA: Diagnosis not present

## 2020-07-26 MED ORDER — IOHEXOL 350 MG/ML SOLN: 100.0000 mL | Freq: Once | INTRAVENOUS | Status: DC | PRN

## 2020-07-26 MED ORDER — IOHEXOL 350 MG/ML SOLN
100.0000 mL | Freq: Once | INTRAVENOUS | Status: AC | PRN
  Administered 2020-07-26: 100 mL via INTRAVENOUS

## 2020-07-27 ENCOUNTER — Telehealth (INDEPENDENT_AMBULATORY_CARE_PROVIDER_SITE_OTHER): Payer: Medicare Other | Admitting: Internal Medicine

## 2020-07-27 DIAGNOSIS — I77810 Thoracic aortic ectasia: Secondary | ICD-10-CM | POA: Diagnosis not present

## 2020-07-27 DIAGNOSIS — R002 Palpitations: Secondary | ICD-10-CM

## 2020-07-27 DIAGNOSIS — I499 Cardiac arrhythmia, unspecified: Secondary | ICD-10-CM | POA: Diagnosis not present

## 2020-07-27 DIAGNOSIS — I77819 Aortic ectasia, unspecified site: Secondary | ICD-10-CM

## 2020-07-27 DIAGNOSIS — E785 Hyperlipidemia, unspecified: Secondary | ICD-10-CM

## 2020-07-27 DIAGNOSIS — I10 Essential (primary) hypertension: Secondary | ICD-10-CM | POA: Diagnosis not present

## 2020-07-27 DIAGNOSIS — N2889 Other specified disorders of kidney and ureter: Secondary | ICD-10-CM

## 2020-07-27 DIAGNOSIS — G453 Amaurosis fugax: Secondary | ICD-10-CM

## 2020-07-27 NOTE — Progress Notes (Signed)
Virtual Visit via Telephone Note   This visit type was conducted due to national recommendations for restrictions regarding the COVID-19 Pandemic (e.g. social distancing) in an effort to limit this patient's exposure and mitigate transmission in our community.  Due to his co-morbid illnesses, this patient is at least at moderate risk for complications without adequate follow up.  This format is felt to be most appropriate for this patient at this time.  The patient did not have access to video technology/had technical difficulties with video requiring transitioning to audio format only (telephone).  All issues noted in this document were discussed and addressed.  No physical exam could be performed with this format.  Please refer to the patient's chart for his  consent to telehealth for [facility].    Date:  07/27/2020   ID:  Anthony Mendez, DOB 04-11-1941, MRN [id number] The patient was identified using 2 identifiers.  Patient Location: Home Provider Location: Office/Clinic  PCP:  [name], MD  Cardiologist:  Elouise Munroe, MD  Electrophysiologist:  None   Evaluation Performed:  Follow-Up Visit  Chief Complaint:  F/u TAA  History of Present Illness:    Anthony Mendez is a 79 y.o. male with HTN and HLD who presents for incidentally noted thoracic aortic aneurysm and moderate aortic valve regurgitation on echocardiogram obtained for an episode of amaurosis fugax felt to represent TIA.  We arranged follow-up to discuss his CT angiography chest abdomen pelvis.  His wife is on the phone as well for our visit.  Reviewed measurements of thoracic aortic aneurysm.  No abdominal aortic aneurysm.  There are bilateral common iliac artery aneurysms.  These will require follow-up imaging, I will review this with Dr. Cyndia Bent after their appointment.  If needed we can refer to vascular.  We reviewed bilateral renal cysts with indeterminate characteristics and left exophytic mass  requiring further imaging.  I have answered all questions to the best my ability and we will arrange for MRI of the abdomen per radiology recommendations.  Discussed pulmonary nodules.  No smoking history.  Dedicated chest imaging is unlikely to be required, however he will have follow-up CT angiography of his aorta and pulmonary nodules can be followed with future testing.  Discussed no heavy lifting restrictions for thoracic aortic aneurysms.  Would recommend no more than 15 pounds of lifting.  Patient has a boat docked in [place], I have encouraged him to avoid strenuous activities on the boat.  Tolerating losartan well.  Not checking his blood pressure at home.  We discussed keeping a BP log.  The patient does not have symptoms concerning for COVID-19 infection (fever, chills, cough, or new shortness of breath).    Past Medical History:  Diagnosis Date  . Essential hypertension 11/11/2016  . HEARING LOSS, BILATERAL 06/19/2009  . History of solar dermatitis   . Hyperlipidemia 11/11/2016   Past Surgical History:  Procedure Laterality Date  . EYE SURGERY Right 04/04/2020   Vitrectomy, Membrane Peel  . INNER EAR SURGERY    . TONSILLECTOMY       No outpatient medications have been marked as taking for the 07/27/20 encounter (Telemedicine) with Elouise Munroe, MD.     Allergies:   Patient has no known allergies.   Social History   Tobacco Use  . Smoking status: Never Smoker  . Smokeless tobacco: Never Used  Vaping Use  . Vaping Use: Never used  Substance Use Topics  . Alcohol use: Yes  Alcohol/week: 1.0 standard drink    Types: 1 Glasses of wine per week    Comment: every night   . Drug use: No     Family Hx: The patient's family history includes Hypertension in his mother; Stroke in his mother.  ROS:   Please see the history of present illness.     All other systems reviewed and are negative.   Prior CV studies:   The following studies were reviewed  today:  CTA CAP  Labs/Other Tests and Data Reviewed:    EKG:  No ECG reviewed.  Recent Labs: 07/20/2020: BUN 10; Creatinine, Ser 0.89; Potassium 4.6; Sodium 140   Recent Lipid Panel Lab Results  Component Value Date/Time   CHOL 225 (H) 11/09/2017 09:43 AM   TRIG 122.0 11/09/2017 09:43 AM   HDL 56.20 11/09/2017 09:43 AM   CHOLHDL 4 11/09/2017 09:43 AM   LDLCALC 144 (H) 11/09/2017 09:43 AM   LDLDIRECT 145.9 07/25/2014 10:29 AM    Wt Readings from Last 3 Encounters:  06/20/20 185 lb 3.2 oz (84 kg)  05/18/20 185 lb (83.9 kg)  10/04/19 184 lb 11.2 oz (83.8 kg)     Risk Assessment/Calculations:      Objective:    Vital Signs:  There were no vitals taken for this visit.   VITAL SIGNS:  reviewed GEN:  no acute distress RESPIRATORY:  normal respiratory effort, no increased work of breathing NEURO:  alert and oriented x 3, speech normal PSYCH:  normal affect   ASSESSMENT & PLAN:    1. Ascending aorta dilatation (HCC)   2. Acquired dilation of ascending aorta and aortic root ([place])   3. Essential hypertension   4. Amaurosis fugax of right eye   5. Irregular heart rate   6. Palpitations   7. Hyperlipidemia, unspecified hyperlipidemia type   8. Renal mass    Incidentally noted indeterminate renal mass will require follow-up imaging per radiology recommendation.  We will order.  Discussed with patient in detail  Thoracic aortic aneurysm measuring 4.7 cm on CTA, mild variation from measurement on echo.  Anticipating a visit with Dr. Cyndia Bent as previously arranged by Dr. [name].  Palpitations and amaurosis fugax -cardiac monitor results are pending.  Will consider TEE if needed.   COVID-19 Education: The signs and symptoms of COVID-19 were discussed with the patient and how to seek care for testing (follow up with PCP or arrange E-visit).  The importance of social distancing was discussed today.  Time:   Today, I have spent 25 minutes with the patient with telehealth  technology discussing the above problems.     Medication Adjustments/Labs and Tests Ordered: Current medicines are reviewed at length with the patient today.  Concerns regarding medicines are outlined above.   Patient Instructions  Medication Instructions:  No Changes In Medications at this time.  *If you need a refill on your cardiac medications before your next appointment, please call your pharmacy*  Lab Work: None Ordered At This Time.  If you have labs (blood work) drawn today and your tests are completely normal, you will receive your results only by: Marland Kitchen MyChart Message (if you have MyChart) OR . A paper copy in the mail If you have any lab test that is abnormal or we need to change your treatment, we will call you to review the results.  Follow-Up: At [facility], you and your health needs are our priority.  As part of our continuing mission to provide you with exceptional heart care,  we have created designated Provider Care Teams.  These Care Teams include your primary Cardiologist (physician) and Advanced Practice Providers (APPs -  Physician Assistants and Nurse Practitioners) who all work together to provide you with the care you need, when you need it.  We recommend signing up for the patient portal called "MyChart".  Sign up information is provided on this After Visit Summary.  MyChart is used to connect with patients for Virtual Visits (Telemedicine).  Patients are able to view lab/test results, encounter notes, upcoming appointments, etc.  Non-urgent messages can be sent to your provider as well.   To learn more about what you can do with MyChart, go to [email or web address].    Your next appointment:   At the end of January   The format for your next appointment:   In Person  Provider:   Cherlynn Kaiser, MD     Signed, Elouise Munroe, MD  07/27/2020 2:26 PM    [place]

## 2020-07-27 NOTE — Patient Instructions (Addendum)
Medication Instructions:  No Changes In Medications at this time.  *If you need a refill on your cardiac medications before your next appointment, please call your pharmacy*  Lab Work: None Ordered At This Time.  If you have labs (blood work) drawn today and your tests are completely normal, you will receive your results only by: Marland Kitchen MyChart Message (if you have MyChart) OR . A paper copy in the mail If you have any lab test that is abnormal or we need to change your treatment, we will call you to review the results.  Follow-Up: At [facility], you and your health needs are our priority.  As part of our continuing mission to provide you with exceptional heart care, we have created designated Provider Care Teams.  These Care Teams include your primary Cardiologist (physician) and Advanced Practice Providers (APPs -  Physician Assistants and Nurse Practitioners) who all work together to provide you with the care you need, when you need it.  We recommend signing up for the patient portal called "MyChart".  Sign up information is provided on this After Visit Summary.  MyChart is used to connect with patients for Virtual Visits (Telemedicine).  Patients are able to view lab/test results, encounter notes, upcoming appointments, etc.  Non-urgent messages can be sent to your provider as well.   To learn more about what you can do with MyChart, go to [email or web address].    Your next appointment:   At the end of January   The format for your next appointment:   In Person  Provider:   Cherlynn Kaiser, MD

## 2020-07-30 NOTE — Addendum Note (Signed)
Addended by: Rexanne Mano B on: 07/30/2020 05:02 PM   Modules accepted: Orders

## 2020-08-08 ENCOUNTER — Other Ambulatory Visit: Payer: Self-pay

## 2020-08-08 ENCOUNTER — Encounter: Payer: Self-pay | Admitting: Surgery

## 2020-08-08 ENCOUNTER — Institutional Professional Consult (permissible substitution): Payer: Medicare Other | Admitting: Surgery

## 2020-08-08 VITALS — BP 138/81 | HR 83 | Resp 18 | Ht 70.0 in | Wt 184.0 lb

## 2020-08-08 DIAGNOSIS — I712 Thoracic aortic aneurysm, without rupture, unspecified: Secondary | ICD-10-CM

## 2020-08-08 NOTE — Progress Notes (Signed)
Cardiothoracic Surgery Consultation   PCP is [name], [name], MD Referring Provider is [name], MD  Chief Complaint  Patient presents with  . Thoracic Aortic Aneurysm    CTA chest 07/26/20, ECHO 06/14/20  . Consult    Initial surgical consult     HPI:  The patient is a 79 year old gentleman with history of hypertension, hyperlipidemia, and bilateral hearing loss status post multiple ear surgeries who developed an episode of transient right vision loss in September while on his boat in [place].  He described this as a shade coming down over the right eye which started to improve after a few minutes and resolved completely after he went to sit down.  There are no associated neurologic symptoms.  He mentioned this to his retina specialist Dr. [name], who promptly referred him to his primary physician Dr. [name] for further evaluation.  His ECG showed normal sinus rhythm.  Carotid Doppler examination showed no significant stenosis with minimal plaque.  2D echocardiogram showed normal left ventricular systolic function with mild LVH.  The aortic valve was trileaflet with mild calcification and moderate aortic insufficiency by pressure half-time at 483 ms.  The aortic root at the level of the sinuses of Valsalva was measured at 40 mm.  The ascending aorta was measured at 49 mm.  He was subsequently referred to Dr. [name] and underwent a CTA of the chest, abdomen, and pelvis.  CTA chest showed the aortic root at the sinus level measured approximately 3.7 cm.  There was some pulsation artifact. There was a fusiform ascending aortic aneurysm with maximum diameter of 4.7 cm.  The proximal aortic arch measured 2.8 cm.  There is mild ectasia of the proximal descending thoracic aorta 3.1 cm. The mid descending aorta measured 2.5 cm and the distal descending aorta measured 2.1 cm.  There is also aneurysmal disease of the distal right common iliac artery measuring up to 3.6 cm with  partial calcification of the wall and laminated thrombus within the aneurysm.  There was a aneurysm of the left common iliac artery measuring up to 2.0 cm.  An incidental finding of bilateral renal cysts was identified and an MRI of the abdomen to evaluate this further was recommended.  There are 2 small pulmonary nodules largest of which was 5 mm noted these are most likely benign and no follow-up was recommended in this low risk patient.  The patient is here today with his wife.  He denies any family history of aneurysm or dissection.  There is no family history of connective tissue disorder.  Denies any chest pain or pressure.  He has had no shortness of breath with exertion or at rest.  He denies orthopnea and peripheral edema.  He has had no dizziness or syncope.  Past Medical History:  Diagnosis Date  . Essential hypertension 11/11/2016  . HEARING LOSS, BILATERAL 06/19/2009  . History of solar dermatitis   . Hyperlipidemia 11/11/2016    Past Surgical History:  Procedure Laterality Date  . EYE SURGERY Right 04/04/2020   Vitrectomy, [facility]  . INNER EAR SURGERY    . TONSILLECTOMY      Family History  Problem Relation Age of Onset  . Stroke Mother   . Hypertension Mother     Social History Social History   Tobacco Use  . Smoking status: Never Smoker  . Smokeless tobacco: Never Used  Vaping Use  . Vaping Use: Never used  Substance Use Topics  . Alcohol use:  Yes    Alcohol/week: 1.0 standard drink    Types: 1 Glasses of wine per week    Comment: every night   . Drug use: No    Current Outpatient Medications  Medication Sig Dispense Refill  . aspirin 81 MG tablet Take 162 mg by mouth daily.     . ciprofloxacin (CIPRO) 500 MG tablet Take 500 mg by mouth 2 (two) times daily.    . ciprofloxacin-dexamethasone (CIPRODEX) OTIC suspension SHAKE LIQUID AND INSTILL 3 DROPS IN LEFT EAR THREE TIMES DAILY FOR 10 DAYS    . ciprofloxacin-dexamethasone (CIPRODEX) OTIC suspension  SHAKE LIQUID AND INSTILL 4 DROPS IN BOTH EARS TWICE DAILY FOR 10 DAYS    . fluticasone (FLONASE) 50 MCG/ACT nasal spray     . losartan (COZAAR) 25 MG tablet Take 1 tablet (25 mg total) by mouth daily. 90 tablet 3  . neomycin-polymyxin-hydrocortisone (CORTISPORIN) 3.5-10000-1 OTIC suspension INSTILL 4 TO 5 DROPS IN LEFT EAR BID PRF DRAINAGE    . ofloxacin (OCUFLOX) 0.3 % ophthalmic solution Place 1 drop into the right eye 4 (four) times daily. 5 mL 0  . prednisoLONE acetate (PRED FORTE) 1 % ophthalmic suspension Place 1 drop into the right eye 4 (four) times daily. 5 mL 0   No current facility-administered medications for this visit.    No Known Allergies  Review of Systems  Constitutional: Negative for fatigue.  HENT: Positive for hearing loss.   Eyes: Negative.   Respiratory: Negative for chest tightness and shortness of breath.   Cardiovascular: Negative for chest pain and leg swelling.  Gastrointestinal: Negative.   Endocrine: Negative.   Genitourinary: Negative.   Musculoskeletal: Negative.   Skin: Negative.   Allergic/Immunologic: Negative.   Neurological: Negative for dizziness and syncope.  Hematological: Negative.   Psychiatric/Behavioral: Negative.     BP 138/81 (BP Location: Right Arm, Patient Position: Sitting)   Pulse 83   Resp 18   Ht 5\' 10"  (1.778 m)   Wt 184 lb (83.5 kg)   SpO2 95% Comment: RA with mask on  BMI 26.40 kg/m  Physical Exam Constitutional:      Appearance: Normal appearance. He is normal weight.  HENT:     Head: Normocephalic and atraumatic.  Eyes:     Extraocular Movements: Extraocular movements intact.     Conjunctiva/sclera: Conjunctivae normal.     Pupils: Pupils are equal, round, and reactive to light.  Neck:     Vascular: No carotid bruit.  Cardiovascular:     Rate and Rhythm: Normal rate and regular rhythm.     Pulses: Normal pulses.     Heart sounds: Normal heart sounds. No murmur heard.   Pulmonary:     Effort: Pulmonary  effort is normal.     Breath sounds: Normal breath sounds.  Abdominal:     General: Abdomen is flat.     Palpations: Abdomen is soft.  Musculoskeletal:        General: No swelling. Normal range of motion.     Cervical back: Normal range of motion and neck supple.  Skin:    General: Skin is warm and dry.  Neurological:     General: No focal deficit present.     Mental Status: He is alert and oriented to person, place, and time.  Psychiatric:        Mood and Affect: Mood normal.        Behavior: Behavior normal.        Thought Content: Thought content normal.  Judgment: Judgment normal.      Diagnostic Tests:  ECHOCARDIOGRAM REPORT       Patient Name:  Anthony Mendez Date of Exam: 06/14/2020  Medical Rec #: [id number]    Height:    69.0 in  Accession #:  [id number]   Weight:    185.0 lb  Date of Birth: May 24, 1941    BSA:     1.999 m  Patient Age:  36 years    BP:      102/62 mmHg  Patient Gender: M        HR:      67 bpm.  Exam Location: [facility]   Procedure: 2D Echo, 3D Echo, Color Doppler and Cardiac Doppler   Indications:  G45.9 TIA    History:    Patient has no prior history of Echocardiogram  examinations.         Risk Factors:Hypertension and Dyslipidemia. Palpitations.    Sonographer:  Deliah Boston RDCS  Referring Phys: [facility] Comments: Echo Findings presented to [facility] prior to patient  leaving.  IMPRESSIONS    1. Left ventricular ejection fraction, by estimation, is 55 to 60%. The  left ventricle has normal function. The left ventricle has no regional  wall motion abnormalities. There is mild left ventricular hypertrophy.  Left ventricular diastolic parameters  are indeterminate.  2. Right ventricular systolic function is normal. The right ventricular  size is normal. There is normal pulmonary artery systolic pressure.  3. Left atrial  size was mildly dilated.  4. Right atrial size was mildly dilated.  5. The mitral valve is normal in structure. No evidence of mitral valve  regurgitation.  6. The aortic valve is tricuspid. There is mild calcification of the  aortic valve. Aortic valve regurgitation is moderate.  7. Aortic dilatation noted. There is moderate to severe dilatation of the  ascending aorta, measuring 49 mm. There is mild dilatation at the level of  the sinuses of Valsalva, measuring 40 mm.  8. The inferior vena cava is normal in size with greater than 50%  respiratory variability, suggesting right atrial pressure of 3 mmHg.   Comparison(s): No prior Echocardiogram.   Conclusion(s)/Recommendation(s): New moderate to severe aortic aneurysm  with at moderate aortic insufficiency. Reaching out to primary.   FINDINGS  Left Ventricle: Left ventricular ejection fraction, by estimation, is 55  to 60%. The left ventricle has normal function. The left ventricle has no  regional wall motion abnormalities. The left ventricular internal cavity  size was normal in size. There is  mild left ventricular hypertrophy. Left ventricular diastolic parameters  are indeterminate.   Right Ventricle: The right ventricular size is normal. No increase in  right ventricular wall thickness. Right ventricular systolic function is  normal. There is normal pulmonary artery systolic pressure. The tricuspid  regurgitant velocity is 2.54 m/s, and  with an assumed right atrial pressure of 3 mmHg, the estimated right  ventricular systolic pressure is 25.8 mmHg.   Left Atrium: Left atrial size was mildly dilated.   Right Atrium: Right atrial size was mildly dilated.   Pericardium: There is no evidence of pericardial effusion.   Mitral Valve: Eccentric MR. The mitral valve is normal in structure. No  evidence of mitral valve regurgitation.   Tricuspid Valve: Eccentric TR. The tricuspid valve is not well visualized.    Tricuspid valve regurgitation is mild.   Aortic Valve: Central AI. No holodiastolic flow reversal. The aortic valve  is tricuspid. There is mild calcification of the aortic valve. Aortic  valve regurgitation is moderate. Aortic regurgitation PHT measures 483  msec.   Pulmonic Valve: The pulmonic valve was not well visualized. Pulmonic valve  regurgitation is not visualized.   Aorta: Aortic dilatation noted and the aortic arch was not well  visualized. There is moderate to severe dilatation of the ascending aorta,  measuring 49 mm. There is mild dilatation at the level of the sinuses of  Valsalva, measuring 40 mm.   Venous: The inferior vena cava is normal in size with greater than 50%  respiratory variability, suggesting right atrial pressure of 3 mmHg.   IAS/Shunts: No atrial level shunt detected by color flow Doppler.     LEFT VENTRICLE  PLAX 2D  LVIDd:     4.00 cm Diastology  LVIDs:     2.70 cm LV e' medial:  7.51 cm/s  LV PW:     1.10 cm LV E/e' medial: 6.5  LV IVS:    1.05 cm LV e' lateral:  12.60 cm/s  LVOT diam:   2.50 cm LV E/e' lateral: 3.9  LV SV:     101  LV SV Index:  50  LVOT Area:   4.91 cm     RIGHT VENTRICLE  RV S prime:   11.30 cm/s  TAPSE (M-mode): 1.9 cm   LEFT ATRIUM       Index    RIGHT ATRIUM      Index  LA diam:    3.80 cm 1.90 cm/m RA Area:   24.00 cm  LA Vol (A2C):  64.5 ml 32.27 ml/m RA Volume:  78.10 ml 39.08 ml/m  LA Vol (A4C):  71.6 ml 35.82 ml/m  LA Biplane Vol: 70.6 ml 35.32 ml/m  AORTIC VALVE  LVOT Vmax:  85.74 cm/s  LVOT Vmean: 58.800 cm/s  LVOT VTI:  0.206 m  AI PHT:   483 msec    AORTA  Ao Root diam: 4.00 cm  Ao Asc diam: 4.90 cm   MITRAL VALVE        TRICUSPID VALVE  MV Area (PHT): cm     TR Peak grad:  25.8 mmHg  MV Decel Time: 272 msec  TR Vmax:    254.00 cm/s  MV E velocity: 48.65 cm/s  MV A velocity: 53.75 cm/s SHUNTS   MV E/A ratio: 0.91    Systemic VTI: 0.21 m               Systemic Diam: 2.50 cm   Rudean Haskell MD  Electronically signed by Rudean Haskell MD  Signature Date/Time: 06/14/2020/3:25:54 PM       Narrative & Impression  CLINICAL DATA:  Thoracic aortic aneurysm and moderate aortic valve regurgitation on echocardiography.  EXAM: CT ANGIOGRAPHY CHEST, ABDOMEN AND PELVIS  TECHNIQUE: Multidetector CT imaging through the chest, abdomen and pelvis was performed using the CT gated protocol during bolus administration of intravenous contrast. Multiplanar reconstructed images and MIPs were obtained and reviewed to evaluate the vascular anatomy.  CONTRAST:  122mL OMNIPAQUE IOHEXOL 350 MG/ML SOLN  COMPARISON:  None.  FINDINGS: CTA CHEST FINDINGS  Cardiovascular: Aortic root at the sinuses measures roughly 3.7 cm. Despite the ECG gating, there is pulsation artifact at the aortic root. Difficult to accurately measure the sinotubular junction. Fusiform aneurysm of the ascending thoracic aorta measuring up to 4.7 cm. No evidence for an aortic dissection. Proximal aortic arch measures 2.8 cm. Typical three-vessel arch anatomy. The great vessels are patent.  Proximal vertebral arteries are patent bilaterally. Scattered areas of artifact related to the ECG gating technique. Distal aortic arch measures roughly 2.3 cm. Proximal descending thoracic aorta is prominent measuring up to 3.1 cm. Atherosclerotic calcifications involving the descending thoracic aorta. Mid descending thoracic aorta measures 2.5 cm. Distal descending thoracic aorta measures 2.1 cm. Limited evaluation of the coronary arteries. Heart size within normal limits. No significant pericardial fluid. Main pulmonary arteries are patent but limited evaluation of the pulmonary arteries beyond the main pulmonary arteries.  Mediastinum/Nodes: Thyroid tissue is unremarkable. Small lymph  nodes throughout the mediastinum or hilum but no significant lymph node enlargement. Mild dilatation of the esophagus.  Lungs/Pleura: Trachea and mainstem bronchi are patent. Focal thickening along the right major fissure on sequence 6, image 53. 2 mm nodule in the right lower lobe on sequence 6, image 65. 5 mm peripheral nodule in the right lower lobe on sequence 6 image 65. No large pleural effusions. No significant airspace disease or lung consolidation. Slightly prominent subpleural densities in both lungs that could represent chronic changes and mild scarring. No evidence for honeycombing or cystic formations.  Musculoskeletal: No acute bone abnormalities.  Review of the MIP images confirms the above findings.  CTA ABDOMEN AND PELVIS FINDINGS  VASCULAR  Aorta: Supraceliac abdominal aorta measures 2.0 cm. Atherosclerotic calcifications in the abdominal aorta without aneurysm and dissection. Abdominal aorta is mildly tortuous. Infrarenal abdominal aorta measures up to 1.9 cm.  Celiac: Patent without significant aneurysm or stenosis.  SMA: Limited evaluation of the proximal SMA due to the artifact from the ECG gating. SMA appears to be widely patent without significant atherosclerotic plaque.  Renals: Limited evaluation of the renal artery origins due to the artifact and poor opacification of the renal arteries. Bilateral renal arteries appear to be patent but very limited evaluation. Contrast was preferentially in the renal veins at the level of the kidneys.  IMA: Patent.  Inflow: Proximal right common iliac artery measures 1.5 cm. Aneurysm involving the distal right common iliac artery measuring up to 3.6 cm on sequence 5, image 158. Aneurysm has peripheral calcifications and a large amount of mural thrombus. Aneurysm involves the right iliac artery bifurcation. Proximal right internal iliac artery is patent but limited evaluation of the right internal  iliac arteries due to artifact. Right external iliac artery is patent. Proximal left common iliac artery measures 1.4 cm. Mid left common iliac artery measures up to 2.0 cm. Distal left common iliac artery measures up to 2.0 cm. Eccentric periphery calcified aneurysm sac involving the distal left common iliac artery at the bifurcation which mostly contains thrombus. Limited evaluation of the left internal iliac artery. Left external iliac artery is patent.  Proximal outflow: Proximal femoral arteries are patent bilaterally.  Veins: Bilateral renal veins are patent. Portal venous system is patent.  Review of the MIP images confirms the above findings.  NON-VASCULAR  Hepatobiliary: No focal liver lesion. Normal appearance of the gallbladder. No significant biliary dilatation.  Pancreas: Unremarkable. No pancreatic ductal dilatation or surrounding inflammatory changes.  Spleen: Normal in size without focal abnormality.  Adrenals/Urinary Tract: Normal appearance of the adrenal glands. 1.1 cm exophytic low-density structure involving the right kidney upper pole is slightly dense with Hounsfield units measuring roughly 45. Low-density exophytic cyst involving the right kidney upper pole. Multiple small cysts involving the left kidney upper pole. Slightly dense low-density structure involving the anterior left kidney on sequence 5 image 118 that measures roughly 9 mm. Concern for an exophytic lesion  in the left kidney lower pole on sequence 5 image 123 but unfortunately there is artifact in this area and this area cannot be evaluated. Normal appearance of the urinary bladder.  Stomach/Bowel: Stomach is within normal limits. Appendix appears normal. No evidence of bowel wall thickening, distention, or inflammatory changes.  Lymphatic: No lymph node enlargement in the abdomen or pelvis.  Reproductive: Enlarged prostate measuring 5.9 cm in diameter.  Other: Small right  inguinal hernia containing fat. Negative for ascites. Negative for free air.  Musculoskeletal: No acute bone abnormality.  Review of the MIP images confirms the above findings.  IMPRESSION: 1. Fusiform aneurysm of the ascending thoracic aorta measuring up to 4.7 cm. Limited evaluation of the aortic root despite using ECG gating. No evidence for an aortic dissection. Ectasia of the proximal descending thoracic aorta measuring up to 3.1 cm. Ascending thoracic aortic aneurysm. Recommend semi-annual imaging followup by CTA or MRA and referral to cardiothoracic surgery if not already obtained. This recommendation follows 2010 ACCF/AHA/AATS/ACR/ASA/SCA/SCAI/SIR/STS/SVM Guidelines for the Diagnosis and Management of Patients With Thoracic Aortic Disease. Circulation. 2010; 121: I016-P537. Aortic aneurysm NOS (ICD10-I71.9) 2. Aneurysm of the distal right common iliac artery measuring up to 3.6 cm. 3. Aneurysm of the left common iliac artery measuring up to 2.0 cm with a small eccentric component at the left iliac artery bifurcation as described. 4.  Aortic Atherosclerosis (ICD10-I70.0). 5. Technically limited examination due to multiple areas of artifact related to the technique. 6. **An incidental finding of potential clinical significance has been found. Bilateral renal cysts. At least 2 of these presumed cystic structures are hyperdense and indeterminate. In addition, there may be an indeterminate exophytic structure involving the left kidney lower pole which is poorly characterized due to the artifact. Recommend additional imaging of the kidneys. If the patient can not tolerate MRI, recommend further characterization with a renal MRI, with and without contrast. Otherwise, recommend a dedicated abdominal CT using renal protocol, with and without contrast.** 7. Scattered subpleural densities in lungs which could represent chronic changes. Two small pulmonary nodules, largest  measuring 5 mm. No follow-up needed if patient is low-risk (and has no known or suspected primary neoplasm). Non-contrast chest CT can be considered in 12 months if patient is high-risk. This recommendation follows the consensus statement: Guidelines for Management of Incidental Pulmonary Nodules Detected on CT Images: From the Fleischner Society 2017; Radiology 2017; 284:228-243. 8. Enlarged prostate.   Electronically Signed   By: Markus Daft M.D.   On: 07/26/2020 14:14     Impression:  This 79 year old gentleman has a 4.7 cm fusiform ascending aortic aneurysm with a trileaflet aortic valve on echocardiogram with visually mild aortic insufficiency that was read as moderate by AI pressure half-time.  There is mild calcification of aortic valve but no evidence of stenosis.  He is asymptomatic with normal left ventricular systolic function and normal left ventricular internal dimensions.  His aortic root is fairly normal size.  There is mild ectasia of the proximal descending thoracic aorta at 3.1 cm compared to the distal aortic arch 2.3 cm and mid descending thoracic aorta of 2.5 cm.  His ascending aortic aneurysm is still well below the surgical threshold of 5.5 cm in a patient with a trileaflet aortic valve.  His aortic insufficiency does not warrant any therapy at this time.  I reviewed the CTA and echo images with the patient and his wife and answered all their questions.  I stressed the importance of continued good blood pressure control and  preventing further enlargement and acute aortic dissection.  I asked him to check his blood pressure at home at different times during the day and to keep a chart.  I recommended that he have a follow-up CTA of the chest in 1 year and he will require follow-up echocardiogram at intervals to be determined by Dr. [name].  He does have bilateral iliac artery aneurysms which is much larger on the right side I might have recommended vascular surgery  evaluation.  He also has bilateral renal cysts and radiology recommended an MRI of the abdomen to further characterize these.  This can be ordered by Dr. [name].   Plan:  I will see him back in 1 year with a CTA of the chest to follow-up on his ascending aortic aneurysm.  We will make an appointment today with Dr. Annamarie Major for evaluation of his bilateral iliac artery aneurysms.  MRI of his abdomen to evaluate the bilateral renal cysts and follow-up if not can be done by Dr. [name].  Follow-up of his aortic insufficiency with periodic echocardiograms can be done by Dr. [name].  I spent 60 minutes performing this consultation and > 50% of this time was spent face to face counseling and coordinating the care of this patient's ascending aortic aneurysm.     Gaye Pollack, MD [facility] [phone]

## 2020-08-13 ENCOUNTER — Ambulatory Visit: Payer: Medicare Other | Admitting: Internal Medicine

## 2020-08-16 ENCOUNTER — Other Ambulatory Visit: Payer: Self-pay

## 2020-08-16 ENCOUNTER — Ambulatory Visit (INDEPENDENT_AMBULATORY_CARE_PROVIDER_SITE_OTHER): Payer: Medicare Other | Admitting: Ophthalmology

## 2020-08-16 ENCOUNTER — Encounter (INDEPENDENT_AMBULATORY_CARE_PROVIDER_SITE_OTHER): Payer: Self-pay | Admitting: Ophthalmology

## 2020-08-16 DIAGNOSIS — H35371 Puckering of macula, right eye: Secondary | ICD-10-CM | POA: Diagnosis not present

## 2020-08-16 DIAGNOSIS — H35351 Cystoid macular degeneration, right eye: Secondary | ICD-10-CM | POA: Diagnosis not present

## 2020-08-16 DIAGNOSIS — G453 Amaurosis fugax: Secondary | ICD-10-CM | POA: Diagnosis not present

## 2020-08-16 DIAGNOSIS — H35372 Puckering of macula, left eye: Secondary | ICD-10-CM

## 2020-08-16 MED ORDER — KETOROLAC TROMETHAMINE 0.5 % OP SOLN: 1.0000 [drp] | Freq: Four times a day (QID) | OPHTHALMIC | 0 refills | Status: DC

## 2020-08-16 NOTE — Assessment & Plan Note (Signed)
OS, minor no impact on acuity 

## 2020-08-16 NOTE — Progress Notes (Signed)
08/16/2020     CHIEF COMPLAINT Patient presents for Retina Follow Up (3 MO F/U OU///Pt reports vision has remained stable and improving, no f/f, no pain or pressure. )   HISTORY OF PRESENT ILLNESS: Anthony Mendez is a 79 y.o. male who presents to the clinic today for:   HPI    Retina Follow Up    Patient presents with  Other.  In both eyes.  This started 3 months ago.  Severity is mild.  Duration of 3 months.  Since onset it is gradually improving. Additional comments: 3 MO F/U OU   Pt reports vision has remained stable and improving, no f/f, no pain or pressure.        Last edited by Nichola Sizer D on 08/16/2020  8:12 AM. (History)      Referring physician: [name], MD [place],  [place] [id number]  HISTORICAL INFORMATION:   Selected notes from the MEDICAL RECORD NUMBER       CURRENT MEDICATIONS: Current Outpatient Medications (Ophthalmic Drugs)  Medication Sig  . ketorolac (ACULAR) 0.5 % ophthalmic solution Place 1 drop into the right eye 4 (four) times daily.  Marland Kitchen ofloxacin (OCUFLOX) 0.3 % ophthalmic solution Place 1 drop into the right eye 4 (four) times daily.  . prednisoLONE acetate (PRED FORTE) 1 % ophthalmic suspension Place 1 drop into the right eye 4 (four) times daily. (Patient not taking: Reported on 08/16/2020)   No current facility-administered medications for this visit. (Ophthalmic Drugs)   Current Outpatient Medications (Other)  Medication Sig  . aspirin 81 MG tablet Take 162 mg by mouth daily.   . ciprofloxacin (CIPRO) 500 MG tablet Take 500 mg by mouth 2 (two) times daily.  . ciprofloxacin-dexamethasone (CIPRODEX) OTIC suspension SHAKE LIQUID AND INSTILL 3 DROPS IN LEFT EAR THREE TIMES DAILY FOR 10 DAYS  . ciprofloxacin-dexamethasone (CIPRODEX) OTIC suspension SHAKE LIQUID AND INSTILL 4 DROPS IN BOTH EARS TWICE DAILY FOR 10 DAYS  . fluticasone (FLONASE) 50 MCG/ACT nasal spray   . losartan (COZAAR) 25 MG tablet Take 1  tablet (25 mg total) by mouth daily.  Marland Kitchen neomycin-polymyxin-hydrocortisone (CORTISPORIN) 3.5-10000-1 OTIC suspension INSTILL 4 TO 5 DROPS IN LEFT EAR BID PRF DRAINAGE   No current facility-administered medications for this visit. (Other)      REVIEW OF SYSTEMS:    ALLERGIES No Known Allergies  PAST MEDICAL HISTORY Past Medical History:  Diagnosis Date  . Essential hypertension 11/11/2016  . HEARING LOSS, BILATERAL 06/19/2009  . History of solar dermatitis   . Hyperlipidemia 11/11/2016   Past Surgical History:  Procedure Laterality Date  . EYE SURGERY Right 04/04/2020   Vitrectomy, Membrane Peel  . INNER EAR SURGERY    . TONSILLECTOMY      FAMILY HISTORY Family History  Problem Relation Age of Onset  . Stroke Mother   . Hypertension Mother     SOCIAL HISTORY Social History   Tobacco Use  . Smoking status: Never Smoker  . Smokeless tobacco: Never Used  Vaping Use  . Vaping Use: Never used  Substance Use Topics  . Alcohol use: Yes    Alcohol/week: 1.0 standard drink    Types: 1 Glasses of wine per week    Comment: every night   . Drug use: No         OPHTHALMIC EXAM:  Base Eye Exam    Visual Acuity (ETDRS)      Right Left   Dist [place] 20/30 +1 20/20 -  3   Dist ph  20/20 -2        Tonometry (Tonopen, 8:19 AM)      Right Left   Pressure 15 17       Pupils      Dark Light Shape React APD   Right 3 2 Round Brisk None   Left 3 3 Irregular Minimal None       Visual Fields (Counting fingers)      Left Right    Full Full       Extraocular Movement      Right Left    Full Full       Neuro/Psych    Oriented x3: Yes   Mood/Affect: Normal       Dilation    Both eyes: 1.0% Mydriacyl, 2.5% Phenylephrine @ 8:19 AM        Slit Lamp and Fundus Exam    External Exam      Right Left   External Normal Normal       Slit Lamp Exam      Right Left   Lids/Lashes Normal Normal   Conjunctiva/Sclera White and quiet White and quiet   Cornea Clear  Clear   Anterior Chamber Deep and quiet Deep and quiet   Iris Round and reactive Round and reactive   Lens Posterior chamber intraocular lens Posterior chamber intraocular lens   Anterior Vitreous Normal Normal       Fundus Exam      Right Left   Posterior Vitreous Vitrectomized, clear Normal   Disc Normal Normal   C/D Ratio 0.3 0.3   Macula Less topo distortion Normal   Vessels Normal Normal   Periphery Reticular degeneration, no retinal tear Reticular degeneration, no retinal tear          IMAGING AND PROCEDURES  Imaging and Procedures for 08/16/20  OCT, Retina - OU - Both Eyes       Right Eye Quality was good. Scan locations included subfoveal. Central Foveal Thickness: 379. Progression has worsened.   Left Eye Quality was good. Scan locations included subfoveal. Central Foveal Thickness: 276. Findings include vitreomacular adhesion , normal foveal contour.   Notes Slight increase in macular thickening in the right eye, not overt CME.  Will nonetheless start topical NSAID OD                ASSESSMENT/PLAN:  Right epiretinal membrane OS, minor no impact on acuity  Left epiretinal membrane Macular topography OD continues to improve  Amaurosis fugax of right eye Patient had complete work-up of of cerebrovascular and peripheral vascular disease and everything was "clean no problem"  Cystoid macular edema, right eye Minor increase in foveal thickness OD, with no obvious recurrence of ERM, thus this could be mild CME, we will treat with topical NSAID      ICD-10-CM   1. Amaurosis fugax of right eye  G45.3 OCT, Retina - OU - Both Eyes  2. Right epiretinal membrane  H35.371   3. Left epiretinal membrane  H35.372   4. Cystoid macular edema, right eye  H35.351     1.  OD, minor increase in retinal thickening of the fovea not related to epiretinal membrane, we will treat as this might be early CME, with topical NSAID  Ketorolac  1 drop right eye 4 times  daily  2.  3.  Ophthalmic Meds Ordered this visit:  Meds ordered this encounter  Medications  . ketorolac (ACULAR) 0.5 % ophthalmic solution  Sig: Place 1 drop into the right eye 4 (four) times daily.    Dispense:  5 mL    Refill:  0       Return in about 6 weeks (around 09/27/2020) for dilate, OD, OCT.  There are no Patient Instructions on file for this visit.   Explained the diagnoses, plan, and follow up with the patient and they expressed understanding.  Patient expressed understanding of the importance of proper follow up care.   Clent Demark Ashaun Gaughan M.D. Diseases & Surgery of the Retina and Vitreous Retina [facility] Diabetic [place] 08/16/20     Abbreviations: M myopia (nearsighted); A astigmatism; H hyperopia (farsighted); P presbyopia; Mrx spectacle prescription;  CTL contact lenses; OD right eye; OS left eye; OU both eyes  XT exotropia; ET esotropia; PEK punctate epithelial keratitis; PEE punctate epithelial erosions; DES dry eye syndrome; MGD meibomian gland dysfunction; ATs artificial tears; PFAT's preservative free artificial tears; [place] nuclear sclerotic cataract; PSC posterior subcapsular cataract; ERM epi-retinal membrane; PVD posterior vitreous detachment; RD retinal detachment; DM diabetes mellitus; DR diabetic retinopathy; NPDR non-proliferative diabetic retinopathy; PDR proliferative diabetic retinopathy; CSME clinically significant macular edema; DME diabetic macular edema; dbh dot blot hemorrhages; CWS cotton wool spot; POAG primary open angle glaucoma; C/D cup-to-disc ratio; HVF humphrey visual field; GVF goldmann visual field; OCT optical coherence tomography; IOP intraocular pressure; BRVO Branch retinal vein occlusion; CRVO central retinal vein occlusion; CRAO central retinal artery occlusion; BRAO branch retinal artery occlusion; RT retinal tear; SB scleral buckle; PPV pars plana vitrectomy; VH Vitreous hemorrhage; PRP panretinal laser photocoagulation; IVK  intravitreal kenalog; VMT vitreomacular traction; MH Macular hole;  NVD neovascularization of the disc; NVE neovascularization elsewhere; AREDS age related eye disease study; ARMD age related macular degeneration; POAG primary open angle glaucoma; EBMD epithelial/anterior basement membrane dystrophy; ACIOL anterior chamber intraocular lens; IOL intraocular lens; PCIOL posterior chamber intraocular lens; Phaco/IOL phacoemulsification with intraocular lens placement; [place] photorefractive keratectomy; LASIK laser assisted in situ keratomileusis; HTN hypertension; DM diabetes mellitus; COPD chronic obstructive pulmonary disease

## 2020-08-16 NOTE — Assessment & Plan Note (Signed)
Minor increase in foveal thickness OD, with no obvious recurrence of ERM, thus this could be mild CME, we will treat with topical NSAID

## 2020-08-16 NOTE — Assessment & Plan Note (Signed)
Macular topography OD continues to improve

## 2020-08-16 NOTE — Assessment & Plan Note (Signed)
Patient had complete work-up of of cerebrovascular and peripheral vascular disease and everything was "clean no problem"

## 2020-08-27 ENCOUNTER — Encounter: Payer: Self-pay | Admitting: Surgery

## 2020-08-27 ENCOUNTER — Other Ambulatory Visit: Payer: Self-pay

## 2020-08-27 ENCOUNTER — Ambulatory Visit: Payer: Medicare Other | Admitting: Surgery

## 2020-08-27 VITALS — BP 143/88 | HR 73 | Temp 97.8°F | Resp 20 | Ht 70.0 in | Wt 185.0 lb

## 2020-08-27 DIAGNOSIS — I723 Aneurysm of iliac artery: Secondary | ICD-10-CM

## 2020-08-27 NOTE — Progress Notes (Signed)
[facility]   Patient name: Anthony Mendez MRN: [id number] DOB: 01/24/1941 Sex: male   REQUESTING PROVIDER:    Dr. Cyndia Bent   REASON FOR CONSULT:    Iliac aneurysm  HISTORY OF PRESENT ILLNESS:   Anthony Mendez is a 79 y.o. male, who is referred for bilateral iliac artery aneurysms, right greater than left.  This was detected on CT angiogram to better evaluate what turned out to be a 4.9 cm ascending aortic aneurysm.  He does have a history of right eye amaurosis with unknown etiology.  The carotid arteries were widely patent.  He denies any family history of aneurysmal disease.  He is a non-smoker.  He is on antihypertensive medication.  He has had multiple procedures for bilateral hearing loss.    PAST MEDICAL HISTORY    Past Medical History:  Diagnosis Date   Essential hypertension 11/11/2016   HEARING LOSS, BILATERAL 06/19/2009   History of solar dermatitis    Hyperlipidemia 11/11/2016     FAMILY HISTORY   Family History  Problem Relation Age of Onset   Stroke Mother    Hypertension Mother     SOCIAL HISTORY:   Social History   Socioeconomic History   Marital status: Married    Spouse name: Not on file   Number of children: Not on file   Years of education: Not on file   Highest education level: Not on file  Occupational History   Not on file  Tobacco Use   Smoking status: Never Smoker   Smokeless tobacco: Never Used  Vaping Use   Vaping Use: Never used  Substance and Sexual Activity   Alcohol use: Yes    Alcohol/week: 1.0 standard drink    Types: 1 Glasses of wine per week    Comment: every night    Drug use: No   Sexual activity: Not on file  Other Topics Concern   Not on file  Social History Narrative   Not on file   Social Determinants of Health   Financial Resource Strain: Not on file  Food Insecurity: Not on file  Transportation Needs: Not on file  Physical  Activity: Not on file  Stress: Not on file  Social Connections: Not on file  Intimate Partner Violence: Not on file    ALLERGIES:    No Known Allergies  CURRENT MEDICATIONS:    Current Outpatient Medications  Medication Sig Dispense Refill   aspirin 81 MG tablet Take 162 mg by mouth daily.     fluticasone (FLONASE) 50 MCG/ACT nasal spray      ketorolac (ACULAR) 0.5 % ophthalmic solution Place 1 drop into the right eye 4 (four) times daily. 5 mL 0   losartan (COZAAR) 25 MG tablet Take 1 tablet (25 mg total) by mouth daily. 90 tablet 3   ciprofloxacin (CIPRO) 500 MG tablet Take 500 mg by mouth 2 (two) times daily. (Patient not taking: Reported on 08/27/2020)     neomycin-polymyxin-hydrocortisone (CORTISPORIN) 3.5-10000-1 OTIC suspension INSTILL 4 TO 5 DROPS IN LEFT EAR BID PRF DRAINAGE (Patient not taking: Reported on 08/27/2020)     ofloxacin (OCUFLOX) 0.3 % ophthalmic solution Place 1 drop into the right eye 4 (four) times daily. (Patient not taking: Reported on 08/27/2020) 5 mL 0   prednisoLONE acetate (PRED FORTE) 1 % ophthalmic suspension Place 1 drop into the right eye 4 (four) times daily. (Patient not taking: No sig reported) 5 mL 0   No current facility-administered medications  for this visit.    REVIEW OF SYSTEMS:   [X]  denotes positive finding, [ ]  denotes negative finding Cardiac  Comments:  Chest pain or chest pressure:    Shortness of breath upon exertion:    Short of breath when lying flat:    Irregular heart rhythm:        Vascular    Pain in calf, thigh, or hip brought on by ambulation:    Pain in feet at night that wakes you up from your sleep:     Blood clot in your veins:    Leg swelling:  x       Pulmonary    Oxygen at home:    Productive cough:     Wheezing:         Neurologic    Sudden weakness in arms or legs:     Sudden numbness in arms or legs:     Sudden onset of difficulty speaking or slurred speech:    Temporary loss of vision in  one eye:     Problems with dizziness:         Gastrointestinal    Blood in stool:      Vomited blood:         Genitourinary    Burning when urinating:     Blood in urine:        Psychiatric    Major depression:         Hematologic    Bleeding problems:    Problems with blood clotting too easily:        Skin    Rashes or ulcers:        Constitutional    Fever or chills:     PHYSICAL EXAM:   Vitals:   08/27/20 1037  BP: (!) 143/88  Pulse: 73  Resp: 20  Temp: 97.8 F (36.6 C)  SpO2: 98%  Weight: 185 lb (83.9 kg)  Height: 5\' 10"  (1.778 m)    GENERAL: The patient is a well-nourished male, in no acute distress. The vital signs are documented above. CARDIAC: There is a regular rate and rhythm.  VASCULAR: I could not palpate pedal pulse and PULMONARY: Nonlabored respirations ABDOMEN: Soft and non-tender with normal pitched bowel sounds.  MUSCULOSKELETAL: There are no major deformities or cyanosis. NEUROLOGIC: No focal weakness or paresthesias are detected. SKIN: There are no ulcers or rashes noted. PSYCHIATRIC: The patient has a normal affect.  STUDIES:   I have reviewed the following CT angiogram with the following findings:  1. Fusiform aneurysm of the ascending thoracic aorta measuring up to 4.7 cm. Limited evaluation of the aortic root despite using ECG gating. No evidence for an aortic dissection. Ectasia of the proximal descending thoracic aorta measuring up to 3.1 cm. Ascending thoracic aortic aneurysm. Recommend semi-annual imaging followup by CTA or MRA and referral to cardiothoracic surgery if not already obtained. This recommendation follows 2010 ACCF/AHA/AATS/ACR/ASA/SCA/SCAI/SIR/STS/SVM Guidelines for the Diagnosis and Management of Patients With Thoracic Aortic Disease. Circulation. 2010; 121: K160-F093. Aortic aneurysm NOS (ICD10-I71.9) 2. Aneurysm of the distal right common iliac artery measuring up to 3.6 cm. 3. Aneurysm of the left common  iliac artery measuring up to 2.0 cm with a small eccentric component at the left iliac artery bifurcation as described. 4.  Aortic Atherosclerosis (ICD10-I70.0). 5. Technically limited examination due to multiple areas of artifact related to the technique. 6. **An incidental finding of potential clinical significance has been found. Bilateral renal cysts. At least 2 of these  presumed cystic structures are hyperdense and indeterminate. In addition, there may be an indeterminate exophytic structure involving the left kidney lower pole which is poorly characterized due to the artifact. Recommend additional imaging of the kidneys. If the patient can not tolerate MRI, recommend further characterization with a renal MRI, with and without contrast. Otherwise, recommend a dedicated abdominal CT using renal protocol, with and without contrast.** 7. Scattered subpleural densities in lungs which could represent chronic changes. Two small pulmonary nodules, largest measuring 5 mm. No follow-up needed if patient is low-risk (and has no known or suspected primary neoplasm). Non-contrast chest CT can be considered in 12 months if patient is high-risk. This recommendation follows the consensus statement: Guidelines for Management of Incidental Pulmonary Nodules Detected on CT Images: From the Fleischner Society 2017; Radiology 2017; 284:228-243. 8. Enlarged prostate.   ASSESSMENT and PLAN   Bilateral iliac aneurysms, right greater than left.  Maximum diameter of the right iliac aneurysm is 3.6 cm.  The left is 2 cm I discussed that the right meet size criteria for repair.  I think this can be done with embolization of the right hypogastric artery and stent graft exclusion from the common iliac into the external iliac artery.  Potentially a iliac branch device could be used.  I discussed the risk of buttock claudication and access site issues including bleeding.  We will schedule this for early  February.   Leia Alf, MD, FACS [facility] [phone] Pager [phone]

## 2020-09-18 ENCOUNTER — Other Ambulatory Visit: Payer: Self-pay

## 2020-09-18 ENCOUNTER — Ambulatory Visit (INDEPENDENT_AMBULATORY_CARE_PROVIDER_SITE_OTHER): Payer: Medicare Other | Admitting: Ophthalmology

## 2020-09-18 ENCOUNTER — Encounter (INDEPENDENT_AMBULATORY_CARE_PROVIDER_SITE_OTHER): Payer: Self-pay | Admitting: Ophthalmology

## 2020-09-18 DIAGNOSIS — H43822 Vitreomacular adhesion, left eye: Secondary | ICD-10-CM | POA: Diagnosis not present

## 2020-09-18 DIAGNOSIS — H35371 Puckering of macula, right eye: Secondary | ICD-10-CM | POA: Diagnosis not present

## 2020-09-18 DIAGNOSIS — G453 Amaurosis fugax: Secondary | ICD-10-CM | POA: Diagnosis not present

## 2020-09-18 DIAGNOSIS — H35372 Puckering of macula, left eye: Secondary | ICD-10-CM | POA: Diagnosis not present

## 2020-09-18 NOTE — Progress Notes (Signed)
09/18/2020     CHIEF COMPLAINT Patient presents for Retina Follow Up (6 WK FU OD///Pt reports stable vision OD, no new F/F OD, no pain or pressure OD. )   HISTORY OF PRESENT ILLNESS: Anthony Mendez is a 80 y.o. male who presents to the clinic today for:   HPI    Retina Follow Up    Patient presents with  Other.  In right eye.  This started 6 weeks ago.  Duration of 6 weeks.  Since onset it is stable. Additional comments: 6 WK FU OD   Pt reports stable vision OD, no new F/F OD, no pain or pressure OD.        Last edited by Nichola Sizer D on 09/18/2020  9:41 AM. (History)      Referring physician: [name], MD [place],  [place] [id number]  HISTORICAL INFORMATION:   Selected notes from the [facility]: No current outpatient medications on file. (Ophthalmic Drugs)   No current facility-administered medications for this visit. (Ophthalmic Drugs)   Current Outpatient Medications (Other)  Medication Sig  . aspirin 81 MG tablet Take 162 mg by mouth daily.  . ciprofloxacin (CIPRO) 500 MG tablet Take 500 mg by mouth 2 (two) times daily. (Patient not taking: Reported on 08/27/2020)  . fluticasone (FLONASE) 50 MCG/ACT nasal spray   . losartan (COZAAR) 25 MG tablet Take 1 tablet (25 mg total) by mouth daily.  Marland Kitchen neomycin-polymyxin-hydrocortisone (CORTISPORIN) 3.5-10000-1 OTIC suspension INSTILL 4 TO 5 DROPS IN LEFT EAR BID PRF DRAINAGE (Patient not taking: Reported on 08/27/2020)   No current facility-administered medications for this visit. (Other)      REVIEW OF SYSTEMS:    ALLERGIES No Known Allergies  PAST MEDICAL HISTORY Past Medical History:  Diagnosis Date  . Essential hypertension 11/11/2016  . HEARING LOSS, BILATERAL 06/19/2009  . History of solar dermatitis   . Hyperlipidemia 11/11/2016   Past Surgical History:  Procedure Laterality Date  . EYE SURGERY Right 04/04/2020   Vitrectomy,  Membrane Peel  . INNER EAR SURGERY    . TONSILLECTOMY      FAMILY HISTORY Family History  Problem Relation Age of Onset  . Stroke Mother   . Hypertension Mother     SOCIAL HISTORY Social History   Tobacco Use  . Smoking status: Never Smoker  . Smokeless tobacco: Never Used  Vaping Use  . Vaping Use: Never used  Substance Use Topics  . Alcohol use: Yes    Alcohol/week: 1.0 standard drink    Types: 1 Glasses of wine per week    Comment: every night   . Drug use: No         OPHTHALMIC EXAM:  Base Eye Exam    Visual Acuity (ETDRS)      Right Left   Dist [place] 20/30 -2 20/20   Dist ph  20/25 +1        Tonometry (Tonopen, 9:49 AM)      Right Left   Pressure 9 9       Pupils      Dark Light Shape React APD   Right 3 2 Round Brisk None   Left 3 3 Irregular Minimal None       Visual Fields (Counting fingers)      Left Right    Full Full       Extraocular Movement      Right Left  Full Full       Neuro/Psych    Oriented x3: Yes   Mood/Affect: Normal       Dilation    Right eye: 1.0% Mydriacyl, 2.5% Phenylephrine @ 9:50 AM        Slit Lamp and Fundus Exam    External Exam      Right Left   External Normal Normal       Slit Lamp Exam      Right Left   Lids/Lashes Normal Normal   Conjunctiva/Sclera White and quiet White and quiet   Cornea Clear Clear   Anterior Chamber Deep and quiet Deep and quiet   Iris Round and reactive Round and reactive   Lens Posterior chamber intraocular lens Posterior chamber intraocular lens   Anterior Vitreous Normal Normal       Fundus Exam      Right Left   Posterior Vitreous Vitrectomized, clear Normal   Disc Normal Normal   C/D Ratio 0.3 0.3   Macula Less topo distortion Normal   Vessels Normal Normal   Periphery Reticular degeneration, no retinal tear Reticular degeneration, no retinal tear          IMAGING AND PROCEDURES  Imaging and Procedures for 09/18/20  OCT, Retina - OU - Both Eyes        Right Eye Quality was good. Scan locations included subfoveal. Central Foveal Thickness: 378. Progression has improved. Findings include abnormal foveal contour.   Left Eye Quality was good. Scan locations included subfoveal. Central Foveal Thickness: 269. Progression has been stable. Findings include normal foveal contour, vitreomacular adhesion .   Notes Postvitrectomy membrane peel right eye, epiretinal membrane Resolved, and retinal thickening continues to slowly improved.  OD,  Vitreal foveal adhesion OS with no inner retinal distortion, observe                ASSESSMENT/PLAN:  Left epiretinal membrane Minor OS no impact on acuity  Vitreomacular traction syndrome, left Minor OS, no foveal distortion observed      ICD-10-CM   1. Amaurosis fugax of right eye  G45.3 OCT, Retina - OU - Both Eyes  2. Left epiretinal membrane  H35.372   3. Vitreomacular traction syndrome, left  H43.822   4. Right epiretinal membrane  H35.371     1.  No recurrence of amaurosis in the right eye  2.  Visual acuity of the right eye continues to slowly improve as does the macular topographic OCT postvitrectomy membrane peel  3.  No impact of acuity left eye 4.  Patient instructed to discontinue all topical therapy right eye  Ophthalmic Meds Ordered this visit:  No orders of the defined types were placed in this encounter.      Return in about 1 year (around 09/18/2021) for DILATE OU, OCT.  There are no Patient Instructions on file for this visit.   Explained the diagnoses, plan, and follow up with the patient and they expressed understanding.  Patient expressed understanding of the importance of proper follow up care.   Clent Demark Britania Shreeve M.D. Diseases & Surgery of the Retina and Vitreous Retina & Diabetic [place] 09/18/20     Abbreviations: M myopia (nearsighted); A astigmatism; H hyperopia (farsighted); P presbyopia; Mrx spectacle prescription;  CTL contact lenses; OD  right eye; OS left eye; OU both eyes  XT exotropia; ET esotropia; PEK punctate epithelial keratitis; PEE punctate epithelial erosions; DES dry eye syndrome; MGD meibomian gland dysfunction; ATs artificial tears; PFAT's preservative free  artificial tears; [place] nuclear sclerotic cataract; PSC posterior subcapsular cataract; ERM epi-retinal membrane; PVD posterior vitreous detachment; RD retinal detachment; DM diabetes mellitus; DR diabetic retinopathy; NPDR non-proliferative diabetic retinopathy; PDR proliferative diabetic retinopathy; CSME clinically significant macular edema; DME diabetic macular edema; dbh dot blot hemorrhages; CWS cotton wool spot; POAG primary open angle glaucoma; C/D cup-to-disc ratio; HVF humphrey visual field; GVF goldmann visual field; OCT optical coherence tomography; IOP intraocular pressure; BRVO Branch retinal vein occlusion; CRVO central retinal vein occlusion; CRAO central retinal artery occlusion; BRAO branch retinal artery occlusion; RT retinal tear; SB scleral buckle; PPV pars plana vitrectomy; VH Vitreous hemorrhage; PRP panretinal laser photocoagulation; IVK intravitreal kenalog; VMT vitreomacular traction; MH Macular hole;  NVD neovascularization of the disc; NVE neovascularization elsewhere; AREDS age related eye disease study; ARMD age related macular degeneration; POAG primary open angle glaucoma; EBMD epithelial/anterior basement membrane dystrophy; ACIOL anterior chamber intraocular lens; IOL intraocular lens; PCIOL posterior chamber intraocular lens; Phaco/IOL phacoemulsification with intraocular lens placement; [place] photorefractive keratectomy; LASIK laser assisted in situ keratomileusis; HTN hypertension; DM diabetes mellitus; COPD chronic obstructive pulmonary disease

## 2020-09-18 NOTE — Assessment & Plan Note (Signed)
Minor OS no impact on acuity

## 2020-09-18 NOTE — Assessment & Plan Note (Signed)
Minor OS, no foveal distortion observed

## 2020-09-24 ENCOUNTER — Encounter (INDEPENDENT_AMBULATORY_CARE_PROVIDER_SITE_OTHER): Payer: Medicare Other | Admitting: Ophthalmology

## 2020-09-25 ENCOUNTER — Telehealth: Payer: Self-pay | Admitting: Family Medicine

## 2020-09-25 NOTE — Telephone Encounter (Signed)
Left message for patient to call back and schedule Medicare Annual Wellness Visit (AWV) either virtually or in office.   Last AWV no information please schedule at anytime with [facility] Nurse Health Advisor 1 or 2   This should be a 45 minute visit. 

## 2020-09-26 ENCOUNTER — Other Ambulatory Visit: Payer: Self-pay

## 2020-09-26 ENCOUNTER — Encounter: Payer: Self-pay | Admitting: Internal Medicine

## 2020-09-26 ENCOUNTER — Ambulatory Visit: Payer: Medicare Other | Admitting: Internal Medicine

## 2020-09-26 VITALS — BP 116/62 | HR 78 | Ht 70.0 in | Wt 185.2 lb

## 2020-09-26 DIAGNOSIS — I10 Essential (primary) hypertension: Secondary | ICD-10-CM

## 2020-09-26 DIAGNOSIS — I77819 Aortic ectasia, unspecified site: Secondary | ICD-10-CM

## 2020-09-26 DIAGNOSIS — G453 Amaurosis fugax: Secondary | ICD-10-CM

## 2020-09-26 DIAGNOSIS — R002 Palpitations: Secondary | ICD-10-CM

## 2020-09-26 DIAGNOSIS — I712 Thoracic aortic aneurysm, without rupture, unspecified: Secondary | ICD-10-CM

## 2020-09-26 DIAGNOSIS — N2889 Other specified disorders of kidney and ureter: Secondary | ICD-10-CM | POA: Diagnosis not present

## 2020-09-26 DIAGNOSIS — H53121 Transient visual loss, right eye: Secondary | ICD-10-CM

## 2020-09-26 DIAGNOSIS — I499 Cardiac arrhythmia, unspecified: Secondary | ICD-10-CM

## 2020-09-26 DIAGNOSIS — E785 Hyperlipidemia, unspecified: Secondary | ICD-10-CM

## 2020-09-26 NOTE — Progress Notes (Signed)
Cardiology Office Note:    Date:  09/26/2020   ID:  Anthony Mendez, DOB 06-23-1941, MRN [id number]  PCP:  [name], MD  Cardiologist:  Elouise Munroe, MD  Electrophysiologist:  None   Referring MD: [name], MD   Chief Complaint/Reason for Referral: TAA  History of Present Illness:    Anthony Mendez is a 80 y.o. male with a history of HTN and HLD who presents for incidentally noted thoracic aortic aneurysm and moderate aortic valve regurgitation on echocardiogram obtained for an episode of amaurosis fugax felt to represent TIA.  We arranged follow-up to discuss his CT angiography chest abdomen pelvis, with mention of bilateral renal cysts that are indeterminate. I have ordered an abdomen MRI, not yet completed. Also noted pulmonary nodules without recommended follow up though these will be followed with his CTA aorta.   He also has bilateral common iliac artery aneurysms for which he has seen Dr. Trula Slade [facility] and Dr. Lynnette Caffey. Dr. Trula Slade plans to intervene in early February.   Patient is feeling well overall. He has not yet completed cardiac monitor to screen for Afib with recent amarosis fugax event. Strongly recommend, he will do so after he returns from travel.    Past Medical History:  Diagnosis Date  . Essential hypertension 11/11/2016  . HEARING LOSS, BILATERAL 06/19/2009  . History of solar dermatitis   . Hyperlipidemia 11/11/2016    Past Surgical History:  Procedure Laterality Date  . EYE SURGERY Right 04/04/2020   Vitrectomy, [place]  . INNER EAR SURGERY    . TONSILLECTOMY      Current Medications: Current Meds  Medication Sig  . aspirin 81 MG tablet Take 162 mg by mouth daily.  Marland Kitchen losartan (COZAAR) 25 MG tablet Take 1 tablet (25 mg total) by mouth daily.     Allergies:   Patient has no known allergies.   Social History   Tobacco Use  . Smoking status: Never Smoker  . Smokeless tobacco: Never Used  Vaping Use  . Vaping Use:  Never used  Substance Use Topics  . Alcohol use: Yes    Alcohol/week: 1.0 standard drink    Types: 1 Glasses of wine per week    Comment: every night   . Drug use: No     Family History: The patient's family history includes Hypertension in his mother; Stroke in his mother.  ROS:   Please see the history of present illness.    All other systems reviewed and are negative.  EKGs/Labs/Other Studies Reviewed:    The following studies were reviewed today:   Recent Labs: 07/20/2020: BUN 10; Creatinine, Ser 0.89; Potassium 4.6; Sodium 140  Recent Lipid Panel    Component Value Date/Time   CHOL 225 (H) 11/09/2017 0943   TRIG 122.0 11/09/2017 0943   HDL 56.20 11/09/2017 0943   CHOLHDL 4 11/09/2017 0943   VLDL 24.4 11/09/2017 0943   LDLCALC 144 (H) 11/09/2017 0943   LDLDIRECT 145.9 07/25/2014 1029    Physical Exam:    VS:  BP 116/62 (BP Location: Left Arm, Patient Position: Sitting, Cuff Size: Normal)   Pulse 78   Ht 5\' 10"  (1.778 m)   Wt 185 lb 3.2 oz (84 kg)   BMI 26.57 kg/m     Wt Readings from Last 5 Encounters:  09/26/20 185 lb 3.2 oz (84 kg)  08/27/20 185 lb (83.9 kg)  08/08/20 184 lb (83.5 kg)  06/20/20 185 lb 3.2 oz (84  kg)  05/18/20 185 lb (83.9 kg)    Constitutional: No acute distress Eyes: sclera non-icteric, normal conjunctiva and lids ENMT: normal dentition, moist mucous membranes Cardiovascular: regular rhythm, normal rate, no murmurs. S1 and S2 normal. Radial pulses normal bilaterally. No jugular venous distention.  Respiratory: clear to auscultation bilaterally GI : normal bowel sounds, soft and nontender. No distention.   MSK: extremities warm, well perfused. No edema.  NEURO: grossly nonfocal exam, moves all extremities. PSYCH: alert and oriented x 3, normal mood and affect.   ASSESSMENT:    1. Thoracic aortic aneurysm without rupture ([place])   2. Acquired dilation of ascending aorta and aortic root ([place])   3. Essential hypertension   4.  Amaurosis fugax of right eye   5. Transient visual loss of right eye   6. Irregular heart rate   7. Palpitations   8. Hyperlipidemia, unspecified hyperlipidemia type   9. Renal mass    PLAN:    Thoracic aortic aneurysm without rupture ([place]) - Plan: CT ANGIO CHEST AORTA W/CM & OR WO/CM Acquired dilation of ascending aorta and aortic root ([facility]) Essential hypertension - will repeat aorta scan in 6 mo-12 mo, will follow up with me as well as Dr. Cyndia Bent.  - continue losartan, presumed mechanism is HTN.  Amaurosis fugax of right eye Transient visual loss of right eye Irregular heart rate Palpitations - recommend cardiac monitor to rule out afib.   Hyperlipidemia, unspecified hyperlipidemia type - recommend repeat lipids, none documented since 2019.  Renal mass - recommend abdomen MRI per radiology report. Has not yet completed.  Total time of encounter: 40 minutes total time of encounter, including 30 minutes spent in face-to-face patient care on the date of this encounter. This time includes coordination of care and counseling regarding above mentioned problem list. Remainder of non-face-to-face time involved reviewing chart documents/testing relevant to the patient encounter and documentation in the medical record. I have independently reviewed documentation from referring provider.   Cherlynn Kaiser, MD [place]  [facility]    Medication Adjustments/Labs and Tests Ordered: Current medicines are reviewed at length with the patient today.  Concerns regarding medicines are outlined above.   Orders Placed This Encounter  Procedures  . CT ANGIO CHEST AORTA W/CM & OR WO/CM     No orders of the defined types were placed in this encounter.   Patient Instructions  Medication Instructions:  No Changes In Medications at this time.  *If you need a refill on your cardiac medications before your next appointment, please call your pharmacy*  Lab Work: [place]  CTA  If you have labs (blood work) drawn today and your tests are completely normal, you will receive your results only by: Marland Kitchen MyChart Message (if you have MyChart) OR . A paper copy in the mail If you have any lab test that is abnormal or we need to change your treatment, we will call you to review the results.  Testing/Procedures: CTA OF CHEST and AORTA- THIS WILL BE IN MAY 2022- SOMEONE WILL REACH OUT TO YOU TO SCHEDULE THIS  Follow-Up: At [facility], you and your health needs are our priority.  As part of our continuing mission to provide you with exceptional heart care, we have created designated Provider Care Teams.  These Care Teams include your primary Cardiologist (physician) and Advanced Practice Providers (APPs -  Physician Assistants and Nurse Practitioners) who all work together to provide you with the care you need, when you  need it.  Your next appointment:   MAY- AFTER CTA CHEST   The format for your next appointment:   In Person  Provider:   Cherlynn Kaiser, MD

## 2020-09-26 NOTE — Patient Instructions (Signed)
Medication Instructions:  No Changes In Medications at this time.  *If you need a refill on your cardiac medications before your next appointment, please call your pharmacy*  Lab Work: [place] CTA  If you have labs (blood work) drawn today and your tests are completely normal, you will receive your results only by: Marland Kitchen MyChart Message (if you have MyChart) OR . A paper copy in the mail If you have any lab test that is abnormal or we need to change your treatment, we will call you to review the results.  Testing/Procedures: CTA OF CHEST and AORTA- THIS WILL BE IN MAY 2022- SOMEONE WILL REACH OUT TO YOU TO SCHEDULE THIS  Follow-Up: At [facility], you and your health needs are our priority.  As part of our continuing mission to provide you with exceptional heart care, we have created designated Provider Care Teams.  These Care Teams include your primary Cardiologist (physician) and Advanced Practice Providers (APPs -  Physician Assistants and Nurse Practitioners) who all work together to provide you with the care you need, when you need it.  Your next appointment:   MAY- AFTER CTA CHEST   The format for your next appointment:   In Person  Provider:   Cherlynn Kaiser, MD

## 2020-09-27 ENCOUNTER — Encounter (INDEPENDENT_AMBULATORY_CARE_PROVIDER_SITE_OTHER): Payer: Medicare Other | Admitting: Ophthalmology

## 2020-10-04 NOTE — Telephone Encounter (Signed)
Pt is calling back and stated that he would like to wait to schedule the appointment about 4-6 months from now.

## 2020-10-15 ENCOUNTER — Encounter (HOSPITAL_COMMUNITY)
Admission: RE | Admit: 2020-10-15 | Discharge: 2020-10-15 | Disposition: A | Discharge status: Home / Self Care | Payer: Medicare Other | Source: Ambulatory Visit | Attending: Surgery | Admitting: Surgery

## 2020-10-15 ENCOUNTER — Other Ambulatory Visit (HOSPITAL_COMMUNITY)
Admission: RE | Admit: 2020-10-15 | Discharge: 2020-10-15 | Disposition: A | Discharge status: Home / Self Care | Payer: Medicare Other | Source: Ambulatory Visit | Attending: Surgery | Admitting: Surgery

## 2020-10-15 ENCOUNTER — Encounter (HOSPITAL_COMMUNITY): Payer: Self-pay

## 2020-10-15 ENCOUNTER — Other Ambulatory Visit: Payer: Self-pay

## 2020-10-15 DIAGNOSIS — H90A32 Mixed conductive and sensorineural hearing loss, unilateral, left ear with restricted hearing on the contralateral side: Secondary | ICD-10-CM | POA: Diagnosis not present

## 2020-10-15 DIAGNOSIS — I358 Other nonrheumatic aortic valve disorders: Secondary | ICD-10-CM | POA: Insufficient documentation

## 2020-10-15 DIAGNOSIS — I712 Thoracic aortic aneurysm, without rupture: Secondary | ICD-10-CM | POA: Diagnosis not present

## 2020-10-15 DIAGNOSIS — Z20822 Contact with and (suspected) exposure to covid-19: Secondary | ICD-10-CM | POA: Insufficient documentation

## 2020-10-15 DIAGNOSIS — I719 Aortic aneurysm of unspecified site, without rupture: Secondary | ICD-10-CM | POA: Insufficient documentation

## 2020-10-15 DIAGNOSIS — I10 Essential (primary) hypertension: Secondary | ICD-10-CM | POA: Diagnosis not present

## 2020-10-15 DIAGNOSIS — Z79899 Other long term (current) drug therapy: Secondary | ICD-10-CM | POA: Diagnosis not present

## 2020-10-15 DIAGNOSIS — Z7982 Long term (current) use of aspirin: Secondary | ICD-10-CM | POA: Diagnosis not present

## 2020-10-15 DIAGNOSIS — Z823 Family history of stroke: Secondary | ICD-10-CM | POA: Diagnosis not present

## 2020-10-15 DIAGNOSIS — Z01812 Encounter for preprocedural laboratory examination: Secondary | ICD-10-CM | POA: Insufficient documentation

## 2020-10-15 DIAGNOSIS — Z8669 Personal history of other diseases of the nervous system and sense organs: Secondary | ICD-10-CM | POA: Insufficient documentation

## 2020-10-15 DIAGNOSIS — I723 Aneurysm of iliac artery: Secondary | ICD-10-CM | POA: Diagnosis not present

## 2020-10-15 DIAGNOSIS — I739 Peripheral vascular disease, unspecified: Secondary | ICD-10-CM | POA: Diagnosis not present

## 2020-10-15 DIAGNOSIS — Z8249 Family history of ischemic heart disease and other diseases of the circulatory system: Secondary | ICD-10-CM | POA: Diagnosis not present

## 2020-10-15 LAB — URINALYSIS, ROUTINE W REFLEX MICROSCOPIC
Bilirubin Urine: NEGATIVE
Glucose, UA: NEGATIVE mg/dL
Hgb urine dipstick: NEGATIVE
Ketones, ur: NEGATIVE mg/dL
Leukocytes,Ua: NEGATIVE
Nitrite: NEGATIVE
Protein, ur: NEGATIVE mg/dL
Specific Gravity, Urine: 1.018 (ref 1.005–1.030)
pH: 5 (ref 5.0–8.0)

## 2020-10-15 LAB — CBC
HCT: 46.2 % (ref 39.0–52.0)
Hemoglobin: 15.5 g/dL (ref 13.0–17.0)
MCH: 33.6 pg (ref 26.0–34.0)
MCHC: 33.5 g/dL (ref 30.0–36.0)
MCV: 100.2 fL — ABNORMAL HIGH (ref 80.0–100.0)
Platelets: 138 10*3/uL — ABNORMAL LOW (ref 150–400)
RBC: 4.61 MIL/uL (ref 4.22–5.81)
RDW: 12 % (ref 11.5–15.5)
WBC: 8.2 10*3/uL (ref 4.0–10.5)
nRBC: 0 % (ref 0.0–0.2)

## 2020-10-15 LAB — SURGICAL PCR SCREEN
MRSA, PCR: NEGATIVE
Staphylococcus aureus: POSITIVE — AB

## 2020-10-15 LAB — TYPE AND SCREEN
ABO/RH(D): A POS
Antibody Screen: NEGATIVE

## 2020-10-15 LAB — COMPREHENSIVE METABOLIC PANEL
ALT: 27 U/L (ref 0–44)
AST: 25 U/L (ref 15–41)
Albumin: 4.2 g/dL (ref 3.5–5.0)
Alkaline Phosphatase: 47 U/L (ref 38–126)
Anion gap: 12 (ref 5–15)
BUN: 12 mg/dL (ref 8–23)
CO2: 22 mmol/L (ref 22–32)
Calcium: 9.5 mg/dL (ref 8.9–10.3)
Chloride: 104 mmol/L (ref 98–111)
Creatinine, Ser: 0.91 mg/dL (ref 0.61–1.24)
GFR, Estimated: >60 mL/min (ref >60)
Glucose, Bld: 114 mg/dL — ABNORMAL HIGH (ref 70–99)
Potassium: 4.1 mmol/L (ref 3.5–5.1)
Sodium: 138 mmol/L (ref 135–145)
Total Bilirubin: 1.4 mg/dL — ABNORMAL HIGH (ref 0.3–1.2)
Total Protein: 7.3 g/dL (ref 6.5–8.1)

## 2020-10-15 LAB — PROTIME-INR
INR: 1 (ref 0.8–1.2)
Prothrombin Time: 12.8 seconds (ref 11.4–15.2)

## 2020-10-15 LAB — SARS CORONAVIRUS 2 (TAT 6-24 HRS): SARS Coronavirus 2: NEGATIVE

## 2020-10-15 LAB — APTT: aPTT: 30 seconds (ref 24–36)
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
## 2020-10-15 NOTE — Progress Notes (Signed)
Surgical Instructions    Your procedure is scheduled on February 9  Report to [facility] Main Entrance "A" at [place].M., then check in with the Admitting office.  Call this number if you have problems the morning of surgery:  [phone]   If you have any questions prior to your surgery date call [phone]: Open Monday-Friday 8am-4pm    Remember:  Do not eat or drink after midnight the night before your surgery    Take these medicines the morning of surgery with A SIP OF WATER  Eye drops if needed  As of today, STOP taking any Aleve, Naproxen, Ibuprofen, Motrin, Advil, Goody's, BC's, all herbal medications, fish oil, and all vitamins.                     Do not wear jewelry, make up, or nail polish            Do not wear lotions, powders, colognes, or deodorant.            Men may shave face and neck.            Do not bring valuables to the hospital.            [facility] is not responsible for any belongings or valuables.  Do NOT Smoke (Tobacco/Vaping) or drink Alcohol 24 hours prior to your procedure If you use a CPAP at night, you may bring all equipment for your overnight stay.   Contacts, glasses, dentures or bridgework may not be worn into surgery, please bring cases for these belongings   For patients admitted to the hospital, discharge time will be determined by your treatment team.   Patients discharged the day of surgery will not be allowed to drive home, and someone needs to stay with them for 24 hours.    Special instructions:   [place]- Preparing For Surgery  Before surgery, you can play an important role. Because skin is not sterile, your skin needs to be as free of germs as possible. You can reduce the number of germs on your skin by washing with CHG (chlorahexidine gluconate) Soap before surgery.  CHG is an antiseptic cleaner which kills germs and bonds with the skin to continue killing germs even after washing.    Oral Hygiene is also important to  reduce your risk of infection.  Remember - BRUSH YOUR TEETH THE MORNING OF SURGERY WITH YOUR REGULAR TOOTHPASTE  Please do not use if you have an allergy to CHG or antibacterial soaps. If your skin becomes reddened/irritated stop using the CHG.  Do not shave (including legs and underarms) for at least 48 hours prior to first CHG shower. It is OK to shave your face.  Please follow these instructions carefully.   1. If you chose to wash your hair, wash your hair first as usual with your normal shampoo.  2. After you shampoo, rinse your hair and body thoroughly to remove the shampoo.  3. Wash Face and genitals (private parts) with your normal soap.   4. THEN Shower the NIGHT BEFORE SURGERY and the MORNING OF SURGERY with CHG Soap.   5. Use CHG as you would any other liquid soap. You can apply CHG directly to the skin and wash gently with a scrungie or a clean washcloth.   6. Apply the CHG Soap to your body ONLY FROM THE NECK DOWN.  Do not use on open wounds or open sores. Avoid contact with your eyes,  ears, mouth and genitals (private parts). Wash Face and genitals (private parts)  with your normal soap.   7. Wash thoroughly, paying special attention to the area where your surgery will be performed.  8. Thoroughly rinse your body with warm water from the neck down.  9. DO NOT shower/wash with your normal soap after using and rinsing off the CHG Soap.  10. Pat yourself dry with a CLEAN TOWEL.  11. Wear CLEAN PAJAMAS to bed the night before surgery  12. Place CLEAN SHEETS on your bed the night before your surgery  13. DO NOT SLEEP WITH PETS.   Day of Surgery: Wear Clean/Comfortable clothing the morning of surgery Do not apply any deodorants/lotions.   Remember to brush your teeth WITH YOUR REGULAR TOOTHPASTE.   Please read over the following fact sheets that you were given.

## 2020-10-16 ENCOUNTER — Telehealth: Payer: Self-pay | Admitting: *Deleted

## 2020-10-16 NOTE — Telephone Encounter (Signed)
Spoke with pts wife. Answered questions about upcoming procedure. Advised about the logistics of the operative day and what the pt can expect from recovery at home.

## 2020-10-16 NOTE — Anesthesia Preprocedure Evaluation (Addendum)
Anesthesia Evaluation  Patient identified by MRN, date of birth, ID band Patient awake    Reviewed: Allergy & Precautions, NPO status , Patient's Chart, lab work & pertinent test results  Airway Mallampati: III  TM Distance: >3 FB Neck ROM: Full    Dental  (+) Teeth Intact, Dental Advisory Given   Pulmonary neg pulmonary ROS,    Pulmonary exam normal breath sounds clear to auscultation       Cardiovascular hypertension, Pt. on medications (-) angina+ Peripheral Vascular Disease (ascending aortic aneurysm; bilateral iliac artery aneurysms)  (-) Past MI Normal cardiovascular exam Rhythm:Regular Rate:Normal     Neuro/Psych Hearing loss  negative psych ROS   GI/Hepatic negative GI ROS, Neg liver ROS,   Endo/Other  negative endocrine ROS  Renal/GU negative Renal ROS     Musculoskeletal negative musculoskeletal ROS (+)   Abdominal   Peds  Hematology  (+) Blood dyscrasia (thrombocytopenia), ,   Anesthesia Other Findings   Reproductive/Obstetrics                           Anesthesia Physical Anesthesia Plan  ASA: III  Anesthesia Plan: General   Post-op Pain Management:    Induction: Intravenous  PONV Risk Score and Plan: 2 and Dexamethasone and Ondansetron  Airway Management Planned: Oral ETT  Additional Equipment:   Intra-op Plan:   Post-operative Plan: Extubation in OR  Informed Consent: I have reviewed the patients History and Physical, chart, labs and discussed the procedure including the risks, benefits and alternatives for the proposed anesthesia with the patient or authorized representative who has indicated his/her understanding and acceptance.     Dental advisory given  Plan Discussed with: CRNA  Anesthesia Plan Comments: (PAT note by Karoline Caldwell, PA-C: Patient follows with Dr. Cyndia Bent for history of ascending aortic aneurysm.  Last seen 08/08/2020.  Per note, "4.7 cm  fusiform ascending aortic aneurysm with a trileaflet aortic valve on echocardiogram with visually mild aortic insufficiency that was read as moderate by AI pressure half-time."  No intervention has been recommended at this time.  Recommended to follow-up with CTA in 1 year.  He was referred to vascular surgery for bilateral iliac artery aneurysms.  Patient also had recent episode of amaurosis fugax September 2021 without identifiable cause.  Event resolved after a couple minutes. There were no associated neurologic symptoms.  Carotid Doppler examination showed no significant stenosis with minimal plaque.  He has also been evaluated by retina specialist Dr. [name].  Etiology of event unknown.  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  Patient requests that wife be present in preop area due to this.  Preop labs reviewed, unremarkable.  EKG 06/20/2020: Normal sinus rhythm with sinus arrhythmia.  Rate 67.  LAD.  Incomplete right bundle branch block.  TTE 06/14/2020: 1. Left ventricular ejection fraction, by estimation, is 55 to 60%. The  left ventricle has normal function. The left ventricle has no regional  wall motion abnormalities. There is mild left ventricular hypertrophy.  Left ventricular diastolic parameters  are indeterminate.  2. Right ventricular systolic function is normal. The right ventricular  size is normal. There is normal pulmonary artery systolic pressure.  3. Left atrial size was mildly dilated.  4. Right atrial size was mildly dilated.  5. The mitral valve is normal in structure. No  evidence of mitral valve  regurgitation.  6. The aortic valve is tricuspid. There is mild calcification of the  aortic valve. Aortic valve regurgitation is moderate.  7. Aortic dilatation noted. There is moderate to severe dilatation of the  ascending  aorta, measuring 49 mm. There is mild dilatation at the level of  the sinuses of Valsalva, measuring 40 mm.  8. The inferior vena cava is normal in size with greater than 50%  respiratory variability, suggesting right atrial pressure of 3 mmHg.   Carotid duplex 05/31/2020: Summary:  Right Carotid: The extracranial vessels were near-normal with only minimal wall thickening or plaque.  Left Carotid: Velocities in the left ICA are consistent with a 1-39% stenosis.  Vertebrals: Bilateral vertebral arteries demonstrate antegrade flow.  Subclavians: Normal flow hemodynamics were seen in bilateral subclavian arteries.   )      Anesthesia Quick Evaluation

## 2020-10-16 NOTE — Progress Notes (Signed)
Anesthesia Chart Review:  Patient follows with Dr. Cyndia Bent for history of ascending aortic aneurysm.  Last seen 08/08/2020.  Per note, "4.7 cm fusiform ascending aortic aneurysm with a trileaflet aortic valve on echocardiogram with visually mild aortic insufficiency that was read as moderate by AI pressure half-time."  No intervention has been recommended at this time.  Recommended to follow-up with CTA in 1 year.  He was referred to vascular surgery for bilateral iliac artery aneurysms.  Patient also had recent episode of amaurosis fugax September 2021 without identifiable cause.  Event resolved after a couple minutes. There were no associated neurologic symptoms.  Carotid Doppler examination showed no significant stenosis with minimal plaque.  He has also been evaluated by retina specialist Dr. [name].  Etiology of event unknown.  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  Patient requests that wife be present in preop area due to this.  Preop labs reviewed, unremarkable.  EKG 06/20/2020: Normal sinus rhythm with sinus arrhythmia.  Rate 67.  LAD.  Incomplete right bundle branch block.  TTE 06/14/2020: 1. Left ventricular ejection fraction, by estimation, is 55 to 60%. The  left ventricle has normal function. The left ventricle has no regional  wall motion abnormalities. There is mild left ventricular hypertrophy.  Left ventricular diastolic parameters  are indeterminate.  2. Right ventricular systolic function is normal. The right ventricular  size is normal. There is normal pulmonary artery systolic pressure.  3. Left atrial size was mildly dilated.  4. Right atrial size was mildly dilated.  5. The mitral valve is normal in structure. No evidence of mitral valve  regurgitation.  6. The aortic valve is tricuspid. There is mild calcification of the   aortic valve. Aortic valve regurgitation is moderate.  7. Aortic dilatation noted. There is moderate to severe dilatation of the  ascending aorta, measuring 49 mm. There is mild dilatation at the level of  the sinuses of Valsalva, measuring 40 mm.  8. The inferior vena cava is normal in size with greater than 50%  respiratory variability, suggesting right atrial pressure of 3 mmHg.   Carotid duplex 05/31/2020: Summary:  Right Carotid: The extracranial vessels were near-normal with only minimal wall thickening or plaque.  Left Carotid: Velocities in the left ICA are consistent with a 1-39% stenosis.  Vertebrals: Bilateral vertebral arteries demonstrate antegrade flow.  Subclavians: Normal flow hemodynamics were seen in bilateral subclavian arteries.   Wynonia Musty [facility] Short Stay Center/Anesthesiology Phone [phone] 10/16/2020 11:13 AM

## 2020-10-17 ENCOUNTER — Ambulatory Visit (HOSPITAL_COMMUNITY): Payer: Medicare Other | Admitting: Physician Assistant

## 2020-10-17 ENCOUNTER — Encounter: Payer: Self-pay | Admitting: Surgery

## 2020-10-17 ENCOUNTER — Inpatient Hospital Stay (HOSPITAL_COMMUNITY): Payer: Medicare Other

## 2020-10-17 ENCOUNTER — Other Ambulatory Visit: Payer: Self-pay

## 2020-10-17 ENCOUNTER — Encounter (HOSPITAL_COMMUNITY)
Admission: RE | Disposition: A | Discharge status: Home / Self Care | Payer: Self-pay | Source: Home / Self Care | Attending: Surgery

## 2020-10-17 ENCOUNTER — Other Ambulatory Visit: Payer: Self-pay | Admitting: *Deleted

## 2020-10-17 ENCOUNTER — Inpatient Hospital Stay (HOSPITAL_COMMUNITY)
Admission: RE | Admit: 2020-10-17 | Discharge: 2020-10-18 | DRG: 272 | Disposition: A | Discharge status: Home / Self Care | Payer: Medicare Other | Attending: Surgery | Admitting: Surgery

## 2020-10-17 ENCOUNTER — Ambulatory Visit (HOSPITAL_COMMUNITY): Payer: Medicare Other | Admitting: Anesthesiology

## 2020-10-17 ENCOUNTER — Encounter (HOSPITAL_COMMUNITY): Payer: Self-pay | Admitting: Surgery

## 2020-10-17 ENCOUNTER — Ambulatory Visit (HOSPITAL_COMMUNITY): Payer: Medicare Other

## 2020-10-17 DIAGNOSIS — I723 Aneurysm of iliac artery: Secondary | ICD-10-CM

## 2020-10-17 DIAGNOSIS — Z8669 Personal history of other diseases of the nervous system and sense organs: Secondary | ICD-10-CM | POA: Diagnosis not present

## 2020-10-17 DIAGNOSIS — Z9889 Other specified postprocedural states: Secondary | ICD-10-CM

## 2020-10-17 DIAGNOSIS — H90A32 Mixed conductive and sensorineural hearing loss, unilateral, left ear with restricted hearing on the contralateral side: Secondary | ICD-10-CM | POA: Diagnosis present

## 2020-10-17 DIAGNOSIS — I712 Thoracic aortic aneurysm, without rupture: Secondary | ICD-10-CM | POA: Diagnosis present

## 2020-10-17 DIAGNOSIS — Z79899 Other long term (current) drug therapy: Secondary | ICD-10-CM | POA: Diagnosis not present

## 2020-10-17 DIAGNOSIS — Z823 Family history of stroke: Secondary | ICD-10-CM | POA: Diagnosis not present

## 2020-10-17 DIAGNOSIS — Z20822 Contact with and (suspected) exposure to covid-19: Secondary | ICD-10-CM | POA: Diagnosis present

## 2020-10-17 DIAGNOSIS — I739 Peripheral vascular disease, unspecified: Secondary | ICD-10-CM | POA: Diagnosis not present

## 2020-10-17 DIAGNOSIS — Z8249 Family history of ischemic heart disease and other diseases of the circulatory system: Secondary | ICD-10-CM | POA: Diagnosis not present

## 2020-10-17 DIAGNOSIS — I10 Essential (primary) hypertension: Secondary | ICD-10-CM | POA: Diagnosis present

## 2020-10-17 DIAGNOSIS — Z7982 Long term (current) use of aspirin: Secondary | ICD-10-CM

## 2020-10-17 HISTORY — PX: INSERTION OF ILIAC STENT: SHX6256

## 2020-10-17 LAB — BASIC METABOLIC PANEL
Anion gap: 9 (ref 5–15)
BUN: 13 mg/dL (ref 8–23)
CO2: 23 mmol/L (ref 22–32)
Calcium: 8.7 mg/dL — ABNORMAL LOW (ref 8.9–10.3)
Chloride: 107 mmol/L (ref 98–111)
Creatinine, Ser: 0.9 mg/dL (ref 0.61–1.24)
GFR, Estimated: >60 mL/min (ref >60)
Glucose, Bld: 125 mg/dL — ABNORMAL HIGH (ref 70–99)
Potassium: 4.7 mmol/L (ref 3.5–5.1)
Sodium: 139 mmol/L (ref 135–145)

## 2020-10-17 LAB — CBC
HCT: 39.8 % (ref 39.0–52.0)
Hemoglobin: 13.9 g/dL (ref 13.0–17.0)
MCH: 34.8 pg — ABNORMAL HIGH (ref 26.0–34.0)
MCHC: 34.9 g/dL (ref 30.0–36.0)
MCV: 99.5 fL (ref 80.0–100.0)
Platelets: 106 10*3/uL — ABNORMAL LOW (ref 150–400)
RBC: 4 MIL/uL — ABNORMAL LOW (ref 4.22–5.81)
RDW: 12 % (ref 11.5–15.5)
WBC: 7 10*3/uL (ref 4.0–10.5)
nRBC: 0 % (ref 0.0–0.2)

## 2020-10-17 LAB — ABO/RH: ABO/RH(D): A POS

## 2020-10-17 LAB — PROTIME-INR
INR: 1.2 (ref 0.8–1.2)
Prothrombin Time: 14.2 seconds (ref 11.4–15.2)

## 2020-10-17 LAB — POCT ACTIVATED CLOTTING TIME
Activated Clotting Time: 214 seconds
Activated Clotting Time: 231 seconds

## 2020-10-17 LAB — MAGNESIUM: Magnesium: 2.2 mg/dL (ref 1.7–2.4)

## 2020-10-17 LAB — APTT: aPTT: 33 seconds (ref 24–36)

## 2020-10-17 SURGERY — INSERTION, STENT, ARTERY, ILIAC
Anesthesia: General | Laterality: Right

## 2020-10-17 MED ORDER — CHLORHEXIDINE GLUCONATE CLOTH 2 % EX PADS: 6.0000 | MEDICATED_PAD | Freq: Once | CUTANEOUS | Status: DC

## 2020-10-17 MED ORDER — GLYCOPYRROLATE PF 0.2 MG/ML IJ SOSY
PREFILLED_SYRINGE | INTRAMUSCULAR | Status: AC
  Filled 2020-10-17: qty 1

## 2020-10-17 MED ORDER — METOPROLOL TARTRATE 5 MG/5ML IV SOLN: 2.0000 mg | INTRAVENOUS | Status: DC | PRN

## 2020-10-17 MED ORDER — GUAIFENESIN-DM 100-10 MG/5ML PO SYRP: 15.0000 mL | ORAL_SOLUTION | ORAL | Status: DC | PRN

## 2020-10-17 MED ORDER — CEFAZOLIN SODIUM-DEXTROSE 2-4 GM/100ML-% IV SOLN
2.0000 g | Freq: Three times a day (TID) | INTRAVENOUS | Status: AC
  Administered 2020-10-17 (×2): 2 g via INTRAVENOUS
  Filled 2020-10-17 (×2): qty 100

## 2020-10-17 MED ORDER — PHENYLEPHRINE HCL-NACL 10-0.9 MG/250ML-% IV SOLN
INTRAVENOUS | Status: DC | PRN
  Administered 2020-10-17: 40 ug/min via INTRAVENOUS

## 2020-10-17 MED ORDER — PANTOPRAZOLE SODIUM 40 MG PO TBEC
40.0000 mg | DELAYED_RELEASE_TABLET | Freq: Every day | ORAL | Status: DC
  Administered 2020-10-17 – 2020-10-18 (×2): 40 mg via ORAL
  Filled 2020-10-17 (×2): qty 1

## 2020-10-17 MED ORDER — SODIUM CHLORIDE 0.9 % IV SOLN: 500.0000 mL | Freq: Once | INTRAVENOUS | Status: DC | PRN

## 2020-10-17 MED ORDER — SENNOSIDES-DOCUSATE SODIUM 8.6-50 MG PO TABS: 1.0000 | ORAL_TABLET | Freq: Every evening | ORAL | Status: DC | PRN

## 2020-10-17 MED ORDER — ACETAMINOPHEN 325 MG PO TABS: 325.0000 mg | ORAL_TABLET | ORAL | Status: DC | PRN

## 2020-10-17 MED ORDER — ORAL CARE MOUTH RINSE: 15.0000 mL | Freq: Once | OROMUCOSAL | Status: AC

## 2020-10-17 MED ORDER — LACTATED RINGERS IV SOLN: INTRAVENOUS | Status: DC

## 2020-10-17 MED ORDER — BISACODYL 5 MG PO TBEC: 5.0000 mg | DELAYED_RELEASE_TABLET | Freq: Every day | ORAL | Status: DC | PRN

## 2020-10-17 MED ORDER — CEFAZOLIN SODIUM-DEXTROSE 2-3 GM-%(50ML) IV SOLR: INTRAVENOUS | Status: DC | PRN

## 2020-10-17 MED ORDER — SODIUM CHLORIDE 0.9 % IV SOLN: INTRAVENOUS | Status: DC

## 2020-10-17 MED ORDER — POLYETHYL GLYCOL-PROPYL GLYCOL 0.4-0.3 % OP SOLN: 1.0000 [drp] | Freq: Three times a day (TID) | OPHTHALMIC | Status: DC | PRN

## 2020-10-17 MED ORDER — MIDAZOLAM HCL 2 MG/2ML IJ SOLN
INTRAMUSCULAR | Status: AC
  Filled 2020-10-17: qty 2

## 2020-10-17 MED ORDER — ONDANSETRON HCL 4 MG/2ML IJ SOLN: 4.0000 mg | Freq: Once | INTRAMUSCULAR | Status: DC | PRN

## 2020-10-17 MED ORDER — SODIUM CHLORIDE (PF) 0.9 % IJ SOLN
INTRAMUSCULAR | Status: AC
  Filled 2020-10-17: qty 10

## 2020-10-17 MED ORDER — ROCURONIUM BROMIDE 10 MG/ML (PF) SYRINGE
PREFILLED_SYRINGE | INTRAVENOUS | Status: DC | PRN
  Administered 2020-10-17: 70 mg via INTRAVENOUS

## 2020-10-17 MED ORDER — PHENYLEPHRINE HCL-NACL 10-0.9 MG/250ML-% IV SOLN
INTRAVENOUS | Status: AC
  Filled 2020-10-17: qty 750

## 2020-10-17 MED ORDER — HEPARIN SODIUM (PORCINE) 1000 UNIT/ML IJ SOLN
INTRAMUSCULAR | Status: DC | PRN
  Administered 2020-10-17: 8000 [IU] via INTRAVENOUS

## 2020-10-17 MED ORDER — ONDANSETRON HCL 4 MG/2ML IJ SOLN
INTRAMUSCULAR | Status: AC
  Filled 2020-10-17: qty 2

## 2020-10-17 MED ORDER — ENOXAPARIN SODIUM 40 MG/0.4ML ~~LOC~~ SOLN: 40.0000 mg | SUBCUTANEOUS | Status: DC

## 2020-10-17 MED ORDER — NEOSTIGMINE METHYLSULFATE 3 MG/3ML IV SOSY
PREFILLED_SYRINGE | INTRAVENOUS | Status: AC
  Filled 2020-10-17: qty 3

## 2020-10-17 MED ORDER — LABETALOL HCL 5 MG/ML IV SOLN: 10.0000 mg | INTRAVENOUS | Status: DC | PRN

## 2020-10-17 MED ORDER — EPHEDRINE SULFATE-NACL 50-0.9 MG/10ML-% IV SOSY
PREFILLED_SYRINGE | INTRAVENOUS | Status: DC | PRN
  Administered 2020-10-17: 5 mg via INTRAVENOUS
  Administered 2020-10-17: 10 mg via INTRAVENOUS

## 2020-10-17 MED ORDER — OXYCODONE-ACETAMINOPHEN 5-325 MG PO TABS: 1.0000 | ORAL_TABLET | ORAL | Status: DC | PRN

## 2020-10-17 MED ORDER — SODIUM CHLORIDE 0.9 % IV SOLN
INTRAVENOUS | Status: DC | PRN
  Administered 2020-10-17: 09:00:00 500 mL

## 2020-10-17 MED ORDER — DOCUSATE SODIUM 100 MG PO CAPS
100.0000 mg | ORAL_CAPSULE | Freq: Every day | ORAL | Status: DC
  Administered 2020-10-18: 100 mg via ORAL
  Filled 2020-10-17: qty 1

## 2020-10-17 MED ORDER — MAGNESIUM SULFATE 2 GM/50ML IV SOLN
2.0000 g | Freq: Every day | INTRAVENOUS | Status: DC | PRN
Start: 2020-10-17 — End: 2020-10-18

## 2020-10-17 MED ORDER — DEXAMETHASONE SODIUM PHOSPHATE 10 MG/ML IJ SOLN
INTRAMUSCULAR | Status: DC | PRN
  Administered 2020-10-17: 10 mg via INTRAVENOUS

## 2020-10-17 MED ORDER — LIDOCAINE 2% (20 MG/ML) 5 ML SYRINGE
INTRAMUSCULAR | Status: DC | PRN
  Administered 2020-10-17: 80 mg via INTRAVENOUS

## 2020-10-17 MED ORDER — CHLORHEXIDINE GLUCONATE 0.12 % MT SOLN
15.0000 mL | Freq: Once | OROMUCOSAL | Status: AC
  Administered 2020-10-17: 15 mL via OROMUCOSAL
  Filled 2020-10-17: qty 15

## 2020-10-17 MED ORDER — ACETAMINOPHEN 650 MG RE SUPP
325.0000 mg | RECTAL | Status: DC | PRN
Start: 2020-10-17 — End: 2020-10-18

## 2020-10-17 MED ORDER — ONDANSETRON HCL 4 MG/2ML IJ SOLN
INTRAMUSCULAR | Status: DC | PRN
  Administered 2020-10-17: 4 mg via INTRAVENOUS

## 2020-10-17 MED ORDER — PROTAMINE SULFATE 10 MG/ML IV SOLN
INTRAVENOUS | Status: DC | PRN
  Administered 2020-10-17: 50 mg via INTRAVENOUS

## 2020-10-17 MED ORDER — POTASSIUM CHLORIDE CRYS ER 20 MEQ PO TBCR: 20.0000 meq | EXTENDED_RELEASE_TABLET | Freq: Every day | ORAL | Status: DC | PRN

## 2020-10-17 MED ORDER — LIDOCAINE 2% (20 MG/ML) 5 ML SYRINGE
INTRAMUSCULAR | Status: AC
  Filled 2020-10-17: qty 5

## 2020-10-17 MED ORDER — FLEET ENEMA 7-19 GM/118ML RE ENEM: 1.0000 | ENEMA | Freq: Once | RECTAL | Status: DC | PRN

## 2020-10-17 MED ORDER — SUCCINYLCHOLINE CHLORIDE 200 MG/10ML IV SOSY
PREFILLED_SYRINGE | INTRAVENOUS | Status: AC
  Filled 2020-10-17: qty 10

## 2020-10-17 MED ORDER — HYDRALAZINE HCL 20 MG/ML IJ SOLN: 5.0000 mg | INTRAMUSCULAR | Status: DC | PRN

## 2020-10-17 MED ORDER — DIPHENHYDRAMINE HCL 50 MG/ML IJ SOLN
INTRAMUSCULAR | Status: AC
  Filled 2020-10-17: qty 1

## 2020-10-17 MED ORDER — ATORVASTATIN CALCIUM 10 MG PO TABS
20.0000 mg | ORAL_TABLET | Freq: Every day | ORAL | Status: DC
  Administered 2020-10-18: 20 mg via ORAL
  Filled 2020-10-17: qty 2

## 2020-10-17 MED ORDER — LOSARTAN POTASSIUM 25 MG PO TABS
25.0000 mg | ORAL_TABLET | Freq: Every day | ORAL | Status: DC
  Administered 2020-10-18: 25 mg via ORAL
  Filled 2020-10-17: qty 1

## 2020-10-17 MED ORDER — ONDANSETRON HCL 4 MG/2ML IJ SOLN: 4.0000 mg | Freq: Four times a day (QID) | INTRAMUSCULAR | Status: DC | PRN

## 2020-10-17 MED ORDER — ROCURONIUM BROMIDE 10 MG/ML (PF) SYRINGE
PREFILLED_SYRINGE | INTRAVENOUS | Status: AC
  Filled 2020-10-17: qty 10

## 2020-10-17 MED ORDER — FENTANYL CITRATE (PF) 100 MCG/2ML IJ SOLN
INTRAMUSCULAR | Status: DC | PRN
  Administered 2020-10-17: 100 ug via INTRAVENOUS

## 2020-10-17 MED ORDER — IODIXANOL 320 MG/ML IV SOLN
INTRAVENOUS | Status: DC | PRN
  Administered 2020-10-17: 40 mL

## 2020-10-17 MED ORDER — FENTANYL CITRATE (PF) 100 MCG/2ML IJ SOLN: 25.0000 ug | INTRAMUSCULAR | Status: DC | PRN

## 2020-10-17 MED ORDER — FENTANYL CITRATE (PF) 250 MCG/5ML IJ SOLN
INTRAMUSCULAR | Status: AC
  Filled 2020-10-17: qty 5

## 2020-10-17 MED ORDER — PROTAMINE SULFATE 10 MG/ML IV SOLN
INTRAVENOUS | Status: AC
  Filled 2020-10-17: qty 5

## 2020-10-17 MED ORDER — CEFAZOLIN SODIUM-DEXTROSE 2-4 GM/100ML-% IV SOLN
2.0000 g | INTRAVENOUS | Status: AC
  Administered 2020-10-17: 2 g via INTRAVENOUS
  Filled 2020-10-17: qty 100

## 2020-10-17 MED ORDER — HEPARIN SODIUM (PORCINE) 1000 UNIT/ML IJ SOLN
INTRAMUSCULAR | Status: AC
  Filled 2020-10-17: qty 1

## 2020-10-17 MED ORDER — PROPOFOL 10 MG/ML IV BOLUS
INTRAVENOUS | Status: AC
  Filled 2020-10-17: qty 40

## 2020-10-17 MED ORDER — ASPIRIN EC 81 MG PO TBEC
81.0000 mg | DELAYED_RELEASE_TABLET | Freq: Every day | ORAL | Status: DC
Start: 2020-10-17 — End: 2020-10-18
  Administered 2020-10-17 – 2020-10-18 (×2): 81 mg via ORAL
  Filled 2020-10-17 (×2): qty 1

## 2020-10-17 MED ORDER — EPHEDRINE 5 MG/ML INJ
INTRAVENOUS | Status: AC
  Filled 2020-10-17: qty 10

## 2020-10-17 MED ORDER — PROPOFOL 10 MG/ML IV BOLUS
INTRAVENOUS | Status: DC | PRN
  Administered 2020-10-17: 140 mg via INTRAVENOUS

## 2020-10-17 MED ORDER — ARTIFICIAL TEARS OPHTHALMIC OINT
TOPICAL_OINTMENT | OPHTHALMIC | Status: AC
  Filled 2020-10-17: qty 3.5

## 2020-10-17 MED ORDER — PHENYLEPHRINE 40 MCG/ML (10ML) SYRINGE FOR IV PUSH (FOR BLOOD PRESSURE SUPPORT)
PREFILLED_SYRINGE | INTRAVENOUS | Status: AC
  Filled 2020-10-17: qty 10

## 2020-10-17 MED ORDER — PHENOL 1.4 % MT LIQD: 1.0000 | OROMUCOSAL | Status: DC | PRN

## 2020-10-17 MED ORDER — SODIUM CHLORIDE 0.9 % IV SOLN
INTRAVENOUS | Status: AC
  Filled 2020-10-17: qty 1.2

## 2020-10-17 MED ORDER — ALUM & MAG HYDROXIDE-SIMETH 200-200-20 MG/5ML PO SUSP: 15.0000 mL | ORAL | Status: DC | PRN

## 2020-10-17 MED ORDER — HYDROMORPHONE HCL 1 MG/ML IJ SOLN: 0.5000 mg | INTRAMUSCULAR | Status: DC | PRN

## 2020-10-17 MED ORDER — GLYCOPYRROLATE PF 0.2 MG/ML IJ SOSY
PREFILLED_SYRINGE | INTRAMUSCULAR | Status: DC | PRN
  Administered 2020-10-17: .2 mg via INTRAVENOUS

## 2020-10-17 MED ORDER — ACETAMINOPHEN 500 MG PO TABS
1000.0000 mg | ORAL_TABLET | Freq: Once | ORAL | Status: AC
  Administered 2020-10-17: 1000 mg via ORAL
  Filled 2020-10-17: qty 2

## 2020-10-17 SURGICAL SUPPLY — 54 items
ADH SKN CLS APL DERMABOND .7 (GAUZE/BANDAGES/DRESSINGS) ×2
BALLN ATLAS 14X40X75 (BALLOONS) ×3
BALLOON ATLAS 14X40X75 (BALLOONS) IMPLANT
CANISTER SUCT 3000ML PPV (MISCELLANEOUS) ×3 IMPLANT
CATH BEACON 5 .035 65 KMP TIP (CATHETERS) ×2 IMPLANT
CATH OMNI FLUSH .035X70CM (CATHETERS) ×2 IMPLANT
CLOSURE PERCLOSE PROSTYLE (VASCULAR PRODUCTS) ×8 IMPLANT
DERMABOND ADVANCED (GAUZE/BANDAGES/DRESSINGS) ×4
DERMABOND ADVANCED .7 DNX12 (GAUZE/BANDAGES/DRESSINGS) ×1 IMPLANT
DEVICE ENSNARE  12MMX20MM (VASCULAR PRODUCTS) ×3
DEVICE ENSNARE 12MMX20MM (VASCULAR PRODUCTS) IMPLANT
DEVICE TORQUE H2O (MISCELLANEOUS) ×2 IMPLANT
DRAPE C-ARM 42X72 X-RAY (DRAPES) IMPLANT
DRYSEAL FLEXSHEATH 12FR 45CM (SHEATH) ×2
DRYSEAL FLEXSHEATH 16FR 33CM (SHEATH) ×2
ELECT REM PT RETURN 9FT ADLT (ELECTROSURGICAL) ×3
ELECTRODE REM PT RTRN 9FT ADLT (ELECTROSURGICAL) ×1 IMPLANT
ENDOPRO ILIAC 23X14X10 FA (Endovascular Graft) ×3 IMPLANT
ENDOPROSTHESIS ILI 23X14X10 FA (Endovascular Graft) IMPLANT
GLOVE BIOGEL PI IND STRL 7.5 (GLOVE) ×1 IMPLANT
GLOVE BIOGEL PI INDICATOR 7.5 (GLOVE) ×2
GLOVE SURG SS PI 7.5 STRL IVOR (GLOVE) ×3 IMPLANT
GOWN STRL REUS W/ TWL LRG LVL3 (GOWN DISPOSABLE) ×2 IMPLANT
GOWN STRL REUS W/ TWL XL LVL3 (GOWN DISPOSABLE) ×1 IMPLANT
GOWN STRL REUS W/TWL LRG LVL3 (GOWN DISPOSABLE) ×6
GOWN STRL REUS W/TWL XL LVL3 (GOWN DISPOSABLE) ×3
GRAFT BALLN CATH 65CM (STENTS) IMPLANT
GRAFT EXCLUDER AORTIC 23X3.3CM (Endovascular Graft) ×2 IMPLANT
GUIDEWIRE ANGLED .035X150CM (WIRE) ×4 IMPLANT
KIT BASIN OR (CUSTOM PROCEDURE TRAY) ×3 IMPLANT
KIT ENCORE 26 ADVANTAGE (KITS) ×2 IMPLANT
KIT TURNOVER KIT B (KITS) ×3 IMPLANT
NS IRRIG 1000ML POUR BTL (IV SOLUTION) ×4 IMPLANT
PACK ENDO MINOR (CUSTOM PROCEDURE TRAY) ×3 IMPLANT
PACK ENDOVASCULAR (PACKS) ×2 IMPLANT
PAD ARMBOARD 7.5X6 YLW CONV (MISCELLANEOUS) ×3 IMPLANT
SET MICROPUNCTURE 5F STIFF (MISCELLANEOUS) ×5 IMPLANT
SHEATH BRITE TIP 8FR 23CM (SHEATH) ×2 IMPLANT
SHEATH DRYSEAL FLEX 12FR 45CM (SHEATH) IMPLANT
SHEATH DRYSEAL FLEX 16FR 33CM (SHEATH) IMPLANT
SHEATH PINNACLE 8F 10CM (SHEATH) ×3 IMPLANT
STENT GRAFT BALLN CATH 65CM (STENTS) ×3
STENT VIABAHN 11X79X135 (Permanent Stent) IMPLANT
STENT VIABAHN 11X79X135MM (Permanent Stent) ×3 IMPLANT
STOPCOCK MORSE 400PSI 3WAY (MISCELLANEOUS) ×5 IMPLANT
SUT MNCRL AB 4-0 PS2 18 (SUTURE) ×2 IMPLANT
TOWEL GREEN STERILE (TOWEL DISPOSABLE) ×3 IMPLANT
TUBING HIGH PRESSURE 120CM (CONNECTOR) ×3 IMPLANT
TUBING INJECTOR 48 (MISCELLANEOUS) ×2 IMPLANT
WATER STERILE IRR 1000ML POUR (IV SOLUTION) ×3 IMPLANT
WIRE AMPLATZ SS-J .035X180CM (WIRE) ×4 IMPLANT
WIRE AMPLATZ SS-J .035X260CM (WIRE) IMPLANT
WIRE BENTSON .035X145CM (WIRE) ×5 IMPLANT
WIRE ROSEN-J .035X260CM (WIRE) ×2 IMPLANT

## 2020-10-17 NOTE — Progress Notes (Signed)
Patient brought to 4E from PACU. Telemetry applied, CCMD notified. CHG bath complete. VSS. Patient states pain 0/10 on pain scale. Bilateral groin sites clean, dry and intact, no redness or swelling. Patient oriented to room and staff. Call bell in reach.  Daymon Larsen, RN

## 2020-10-17 NOTE — H&P (Signed)
[facility] [place]  Patient name: Anthony Mendez          MRN: [id number]        DOB: [birth year]            Sex: male   REQUESTING PROVIDER:    Dr. Cyndia Bent   REASON FOR CONSULT:    Iliac aneurysm  HISTORY OF PRESENT ILLNESS:   Anthony Mendez is a 80 y.o. male, who is referred for bilateral iliac artery aneurysms, right greater than left.  This was detected on CT angiogram to better evaluate what turned out to be a 4.9 cm ascending aortic aneurysm.  He does have a history of right eye amaurosis with unknown etiology.  The carotid arteries were widely patent.  He denies any family history of aneurysmal disease.  He is a non-smoker.  He is on antihypertensive medication.  He has had multiple procedures for bilateral hearing loss.    PAST MEDICAL HISTORY        Past Medical History:  Diagnosis Date  . Essential hypertension 11/11/2016  . HEARING LOSS, BILATERAL 06/19/2009  . History of solar dermatitis   . Hyperlipidemia 11/11/2016     FAMILY HISTORY        Family History  Problem Relation Age of Onset  . Stroke Mother   . Hypertension Mother     SOCIAL HISTORY:   Social History        Socioeconomic History  . Marital status: Married    Spouse name: Not on file  . Number of children: Not on file  . Years of education: Not on file  . Highest education level: Not on file  Occupational History  . Not on file  Tobacco Use  . Smoking status: Never Smoker  . Smokeless tobacco: Never Used  Vaping Use  . Vaping Use: Never used  Substance and Sexual Activity  . Alcohol use: Yes    Alcohol/week: 1.0 standard drink    Types: 1 Glasses of wine per week    Comment: every night   . Drug use: No  . Sexual activity: Not on file  Other Topics Concern  . Not on file  Social History Narrative  . Not on file   Social Determinants of Health   Financial Resource Strain: Not on file  Food  Insecurity: Not on file  Transportation Needs: Not on file  Physical Activity: Not on file  Stress: Not on file  Social Connections: Not on file  Intimate Partner Violence: Not on file    ALLERGIES:    No Known Allergies  CURRENT MEDICATIONS:          Current Outpatient Medications  Medication Sig Dispense Refill  . aspirin 81 MG tablet Take 162 mg by mouth daily.    . fluticasone (FLONASE) 50 MCG/ACT nasal spray     . ketorolac (ACULAR) 0.5 % ophthalmic solution Place 1 drop into the right eye 4 (four) times daily. 5 mL 0  . losartan (COZAAR) 25 MG tablet Take 1 tablet (25 mg total) by mouth daily. 90 tablet 3  . ciprofloxacin (CIPRO) 500 MG tablet Take 500 mg by mouth 2 (two) times daily. (Patient not taking: Reported on 08/27/2020)    . neomycin-polymyxin-hydrocortisone (CORTISPORIN) 3.5-10000-1 OTIC suspension INSTILL 4 TO 5 DROPS IN LEFT EAR BID PRF DRAINAGE (Patient not taking: Reported on 08/27/2020)    . ofloxacin (OCUFLOX) 0.3 % ophthalmic solution Place 1 drop into the right eye 4 (four)  times daily. (Patient not taking: Reported on 08/27/2020) 5 mL 0  . prednisoLONE acetate (PRED FORTE) 1 % ophthalmic suspension Place 1 drop into the right eye 4 (four) times daily. (Patient not taking: No sig reported) 5 mL 0   No current facility-administered medications for this visit.    REVIEW OF SYSTEMS:   [X]  denotes positive finding, [ ]  denotes negative finding Cardiac  Comments:  Chest pain or chest pressure:    Shortness of breath upon exertion:    Short of breath when lying flat:    Irregular heart rhythm:        Vascular    Pain in calf, thigh, or hip brought on by ambulation:    Pain in feet at night that wakes you up from your sleep:     Blood clot in your veins:    Leg swelling:  x       Pulmonary    Oxygen at home:    Productive cough:     Wheezing:         Neurologic    Sudden weakness in arms or  legs:     Sudden numbness in arms or legs:     Sudden onset of difficulty speaking or slurred speech:    Temporary loss of vision in one eye:     Problems with dizziness:         Gastrointestinal    Blood in stool:      Vomited blood:         Genitourinary    Burning when urinating:     Blood in urine:        Psychiatric    Major depression:         Hematologic    Bleeding problems:    Problems with blood clotting too easily:        Skin    Rashes or ulcers:        Constitutional    Fever or chills:     PHYSICAL EXAM:      Vitals:   08/27/20 1037  BP: (!) 143/88  Pulse: 73  Resp: 20  Temp: 97.8 F (36.6 C)  SpO2: 98%  Weight: 185 lb (83.9 kg)  Height: 5\' 10"  (1.778 m)    GENERAL: The patient is a well-nourished male, in no acute distress. The vital signs are documented above. CARDIAC: There is a regular rate and rhythm.  VASCULAR: I could not palpate pedal pulse and PULMONARY: Nonlabored respirations ABDOMEN: Soft and non-tender with normal pitched bowel sounds.  MUSCULOSKELETAL: There are no major deformities or cyanosis. NEUROLOGIC: No focal weakness or paresthesias are detected. SKIN: There are no ulcers or rashes noted. PSYCHIATRIC: The patient has a normal affect.  STUDIES:   I have reviewed the following CT angiogram with the following findings:  1. Fusiform aneurysm of the ascending thoracic aorta measuring up to 4.7 cm. Limited evaluation of the aortic root despite using ECG gating. No evidence for an aortic dissection. Ectasia of the proximal descending thoracic aorta measuring up to 3.1 cm. Ascending thoracic aortic aneurysm. Recommend semi-annual imaging followup by CTA or MRA and referral to cardiothoracic surgery if not already obtained. This recommendation follows 2010 ACCF/AHA/AATS/ACR/ASA/SCA/SCAI/SIR/STS/SVM Guidelines for the Diagnosis and Management of Patients With  Thoracic Aortic Disease. Circulation. 2010; 121: Y865-H846. Aortic aneurysm NOS (ICD10-I71.9) 2. Aneurysm of the distal right common iliac artery measuring up to 3.6 cm. 3. Aneurysm of the left common iliac artery measuring up to 2.0 cm with  a small eccentric component at the left iliac artery bifurcation as described. 4. Aortic Atherosclerosis (ICD10-I70.0). 5. Technically limited examination due to multiple areas of artifact related to the technique. 6. **An incidental finding of potential clinical significance has been found. Bilateral renal cysts. At least 2 of these presumed cystic structures are hyperdense and indeterminate. In addition, there may be an indeterminate exophytic structure involving the left kidney lower pole which is poorly characterized due to the artifact. Recommend additional imaging of the kidneys. If the patient can not tolerate MRI, recommend further characterization with a renal MRI, with and without contrast. Otherwise, recommend a dedicated abdominal CT using renal protocol, with and without contrast.** 7. Scattered subpleural densities in lungs which could represent chronic changes. Two small pulmonary nodules, largest measuring 5 mm. No follow-up needed if patient is low-risk (and has no known or suspected primary neoplasm). Non-contrast chest CT can be considered in 12 months if patient is high-risk. This recommendation follows the consensus statement: Guidelines for Management of Incidental Pulmonary Nodules Detected on CT Images: From the Fleischner Society 2017; Radiology 2017; 284:228-243. 8. Enlarged prostate.   ASSESSMENT and PLAN   Bilateral iliac aneurysms, right greater than left.  Maximum diameter of the right iliac aneurysm is 3.6 cm.  The left is 2 cm I discussed that the right meet size criteria for repair.  I think this can be done with embolization of the right hypogastric artery and stent graft exclusion from the common iliac into  the external iliac artery.  Potentially a iliac branch device could be used.  I discussed the risk of buttock claudication and access site issues including bleeding.  We will schedule this for early February.   Leia Alf, MD, FACS [facility] [phone] Pager [phone]   No interval changes.  Still no abdominal or back pain CV:RRR PULM:CTA Abd: soft Plan IBD for right CIA aneurysm.  All questions answered  WElls Mackenzie Groom

## 2020-10-17 NOTE — Anesthesia Procedure Notes (Signed)
Arterial Line Insertion Start/End2/05/2021 8:40 AM, 10/17/2020 8:42 AM Performed by: Catalina Gravel, MD, Hoy Morn, CRNA, CRNA  Patient location: OR. Preanesthetic checklist: patient identified, IV checked, site marked, risks and benefits discussed, surgical consent, monitors and equipment checked, pre-op evaluation, timeout performed and anesthesia consent Patient sedated Right, radial was placed Catheter size: 20 G Hand hygiene performed  and maximum sterile barriers used  Allen's test indicative of satisfactory collateral circulation Attempts: 1 Procedure performed without using ultrasound guided technique. Following insertion, dressing applied. Post procedure assessment: normal  Patient tolerated the procedure well with no immediate complications.

## 2020-10-17 NOTE — Psychosocial Assessment (Signed)
Status post endovascular repair of right common iliac artery aneurysm The patient is resting comfortably in his bed.  He has no complaints of pain. Both groins are soft with well-perfused distal extremities.  The patient is recovering nicely.  Anticipate discharge home tomorrow.  Prior to discharge, he will be on a statin and aspirin.  Anthony Mendez

## 2020-10-17 NOTE — Anesthesia Procedure Notes (Signed)
Procedure Name: Intubation Date/Time: 10/17/2020 8:28 AM Performed by: Hoy Morn, CRNA Pre-anesthesia Checklist: Patient identified, Emergency Drugs available, Suction available and Patient being monitored Patient Re-evaluated:Patient Re-evaluated prior to induction Oxygen Delivery Method: Circle system utilized Preoxygenation: Pre-oxygenation with 100% oxygen Induction Type: IV induction Ventilation: Mask ventilation without difficulty Laryngoscope Size: Miller and 2 Grade View: Grade I Tube type: Oral Tube size: 7.5 mm Number of attempts: 1 Airway Equipment and Method: Stylet and Oral airway Placement Confirmation: ETT inserted through vocal cords under direct vision,  positive ETCO2 and breath sounds checked- equal and bilateral Secured at: 23 cm Tube secured with: Tape Dental Injury: Teeth and Oropharynx as per pre-operative assessment

## 2020-10-17 NOTE — Op Note (Signed)
Patient name: Anthony Mendez MRN: [id number] DOB: 25-Sep-1940 Sex: male  10/17/2020 Pre-operative Diagnosis: Right Iliac aneurysm Post-operative diagnosis:  Same Surgeon:  Annamarie Major Assistants:  Laurence Slate, PA Procedure:   #1: Endovascular repair of a right common iliac artery aneurysm using a iliac branch device (CPT code [phone])   #2: Bilateral common femoral ultrasound-guided access   #3: Selective catheterization right internal iliac artery (CPT code [phone]) Anesthesia: General Blood Loss: Minimal Specimens: None  Findings: Complete exclusion Devices used: Gore IBE 23 x 14 x 10.,  Right internal iliac Gore VBX 11 x 79  Indications: This is an 80 year old gentleman found to have an asymptomatic 3.6 cm right common iliac artery aneurysm.  He comes in today for endovascular repair.  Procedure:  The patient was identified in the holding area and taken to [place] [facility]  The patient was then placed supine on the table. general anesthesia was administered.  The patient was prepped and draped in the usual sterile fashion.  A time out was called and antibiotics were administered.  A PA was necessary to expedite the procedure and assist with technical details.  Ultrasound was used to evaluate bilateral common femoral arteries.  They were widely patent without significant calcification.  A #11 blade was used to make a skin nick bilaterally.  Bilateral common femoral arteries were then cannulated under ultrasound guidance with a micropuncture needle.  A 018 wire was advanced without resistance followed by placement of a micropuncture sheath.  A 035 Bentson wire was then inserted into the abdominal aorta bilaterally.  The subcutaneous tract was dilated with an 8 French dilator and Pro-glide devices were deployed at the 11:00 and 1 o'clock position for preclosure.  8 French sheaths were placed bilaterally.  Amplatz wires were then inserted into the descending thoracic aorta.  A 16 French sheath was  advanced up the right side to the back furcation and a 12 French sheath was advanced up the left side to the aortic bifurcation.  The patient was fully heparinized.  Heparin levels were checked with ACT measurements and redosed appropriately.  Next, a snare was advanced up the left sheath into the distal aorta.  A Glidewire was advanced up the right side and snared and brought out the left-sided sheath.  The main body was then prepared on the back table and inserted through the 16 French sheath on the right side after the Glidewire was loaded through the device.  The stent was advanced up to the aortic bifurcation.  An aortogram was performed locating the aortic bifurcation.  I made sure there was no wire wrap.  The device was then deployed down to the iliac branch.  The dilator for the 12 French sheath was inserted and advanced over the aortic bifurcation and positioned just above the iliac branch.  I then used a Berenstein 2 catheter and a Glidewire and selective the right internal iliac artery and then the anterior primary branch.  A Rosen wire was then inserted through the catheter which was then removed.  I then advanced a Gore VBX stent and positioned this with appropriate overlap within the main body and good coverage into the internal iliac artery as confirmed by angiography.  This was deployed taking the balloon to 10 atm.  I then withdrew the balloon and molded the device overlap.  Next, a 14 x 4 Atlas balloon was inserted through the 12 French sheath and used to mold the device overlap.  Next, the remaining  portion of the main body was deployed into the external iliac artery on the right.  This was then gently molded with a MOB balloon.  The Rosen wire was then removed as was the [employer].  A completion arteriogram was then performed which showed successful exclusion of the right common iliac artery aneurysm with no evidence of endoleak and preservation of blood flow into the internal iliac artery and  the external iliac artery.  The main body device appeared to migrate distally into the common iliac artery for approximately 1 cm, and so I elected to place a proximal cuff.  This was a Gore 23 x 3.3 device.  A final arteriogram was performed which showed successful exclusion without complicating features.  The stiff wires were exchanged out for Bentson wires.  The sheaths were removed, and the arteriotomy sites were closed by securing the previously placed proglide devices.  The patient's heparin was reversed with 50 mg of protamine.  Both groins were hemostatic.  The skin nicks were closed with 4-0 Monocryl.  The patient had brisk Doppler signals at the foot after the procedure with no evidence of embolic complications.  Dermabond was placed on the incisions.  The patient was successfully extubated and taken the recovery room in stable condition.   Disposition: To PACU stable.   Theotis Burrow, M.D., [facility] [place] Office: [phone] Pager:  [phone]

## 2020-10-17 NOTE — Transfer of Care (Signed)
Immediate Anesthesia Transfer of Care Note  Patient: Anthony Mendez  Procedure(s) Performed: ENDOVASCULAR REPAIR ILIAC ARTERY ANEURYSM WITH INSERTION OF RIGHT HYPO STENT (Right )  Patient Location: PACU  Anesthesia Type:General  Level of Consciousness: drowsy  Airway & Oxygen Therapy: Patient Spontanous Breathing and Patient connected to face mask oxygen  Post-op Assessment: Report given to RN and Post -op Vital signs reviewed and stable  Post vital signs: Reviewed and stable  Last Vitals:  Vitals Value Taken Time  BP 129/89 10/17/20 1049  Temp 36.6 C 10/17/20 1045  Pulse 78 10/17/20 1101  Resp 13 10/17/20 1101  SpO2 98 % 10/17/20 1101  Vitals shown include unvalidated device data.  Last Pain:  Vitals:   10/17/20 0716  TempSrc: Temporal  PainSc: 0-No pain      Patients Stated Pain Goal: 3 (09/32/35 5732)  Complications: No complications documented.

## 2020-10-17 NOTE — Anesthesia Postprocedure Evaluation (Signed)
Anesthesia Post Note  Patient: Anthony Mendez  Procedure(s) Performed: ENDOVASCULAR REPAIR ILIAC ARTERY ANEURYSM WITH INSERTION OF RIGHT HYPO STENT (Right )     Patient location during evaluation: PACU Anesthesia Type: General Level of consciousness: awake and alert Pain management: pain level controlled Vital Signs Assessment: post-procedure vital signs reviewed and stable Respiratory status: spontaneous breathing, nonlabored ventilation, respiratory function stable and patient connected to nasal cannula oxygen Cardiovascular status: blood pressure returned to baseline and stable Postop Assessment: no apparent nausea or vomiting Anesthetic complications: no   No complications documented.  Last Vitals:  Vitals:   10/17/20 1153 10/17/20 1335  BP: 112/63 120/80  Pulse: 68 65  Resp: 18 13  Temp: 36.4 C   SpO2: 100% 99%    Last Pain:  Vitals:   10/17/20 1153  TempSrc: Oral  PainSc:                  Catalina Gravel

## 2020-10-17 NOTE — Discharge Instructions (Signed)
   [facility] [place]   Discharge Instructions  Endovascular Aortic Aneurysm Repair  Please refer to the following instructions for your post-procedure care. Your surgeon or Physician Assistant will discuss any changes with you.  Activity  You are encouraged to walk as much as you can. You can slowly return to normal activities but must avoid strenuous activity and heavy lifting until your doctor tells you it's OK. Avoid activities such as vacuuming or swinging a gold club. It is normal to feel tired for several weeks after your surgery. Do not drive until your doctor gives the OK and you are no longer taking prescription pain medications. It is also normal to have difficulty with sleep habits, eating, and bowel movements after surgery. These will go away with time.  Bathing/Showering  You may shower after you go home. If you have an incision, do not soak in a bathtub, hot tub, or swim until the incision heals completely.  Incision Care  Shower every day. Clean your incision with mild soap and water. Pat the area dry with a clean towel. You do not need a bandage unless otherwise instructed. Do not apply any ointments or creams to your incision. If you clothing is irritating, you may cover your incision with a dry gauze pad.  Diet  Resume your normal diet. There are no special food restrictions following this procedure. A low fat/low cholesterol diet is recommended for all patients with vascular disease. In order to heal from your surgery, it is CRITICAL to get adequate nutrition. Your body requires vitamins, minerals, and protein. Vegetables are the best source of vitamins and minerals. Vegetables also provide the perfect balance of protein. Processed food has little nutritional value, so try to avoid this.  Medications  Resume taking all of your medications unless your doctor or nurse practitioner tells you not to. If your incision is causing pain, you may take  over-the-counter pain relievers such as acetaminophen (Tylenol). If you were prescribed a stronger pain medication, please be aware these medications can cause nausea and constipation. Prevent nausea by taking the medication with a snack or meal. Avoid constipation by drinking plenty of fluids and eating foods with a high amount of fiber, such as fruits, vegetables, and grains. Do not take Tylenol if you are taking prescription pain medications.   Follow up  Our office will schedule a follow-up appointment with a C.T. scan 3-4 weeks after your surgery.  Please call us immediately for any of the following conditions  Severe or worsening pain in your legs or feet or in your abdomen back or chest. Increased pain, redness, drainage (pus) from your incision sit. Increased abdominal pain, bloating, nausea, vomiting or persistent diarrhea. Fever of 101 degrees or higher. Swelling in your leg (s),  Reduce your risk of vascular disease  Stop smoking. If you would like help call [facility] at 1-800-QUIT-NOW (1-800-784-8669) or [facility] at [phone]. Manage your cholesterol Maintain a desired weight Control your diabetes Keep your blood pressure down  If you have questions, please call the office at [phone].   

## 2020-10-17 NOTE — Progress Notes (Signed)
Mobility Specialist: Progress Note   10/17/20 1743  Mobility  Activity Ambulated to bathroom  Level of Assistance Independent  Assistive Device None  Distance Ambulated (ft) 20 ft  Mobility Response Tolerated well  Mobility performed by Mobility specialist  $Mobility charge 1 Mobility   Pt asx during ambulation. Pt is eager to walk with staff.   [facility] Mobility Specialist Mobility Specialist Phone: [phone]

## 2020-10-18 ENCOUNTER — Encounter (HOSPITAL_COMMUNITY): Payer: Self-pay | Admitting: Surgery

## 2020-10-18 LAB — BASIC METABOLIC PANEL
Anion gap: 11 (ref 5–15)
BUN: 14 mg/dL (ref 8–23)
CO2: 19 mmol/L — ABNORMAL LOW (ref 22–32)
Calcium: 8.7 mg/dL — ABNORMAL LOW (ref 8.9–10.3)
Chloride: 107 mmol/L (ref 98–111)
Creatinine, Ser: 0.88 mg/dL (ref 0.61–1.24)
GFR, Estimated: >60 mL/min (ref >60)
Glucose, Bld: 164 mg/dL — ABNORMAL HIGH (ref 70–99)
Potassium: 4.2 mmol/L (ref 3.5–5.1)
Sodium: 137 mmol/L (ref 135–145)

## 2020-10-18 LAB — CBC
HCT: 37.1 % — ABNORMAL LOW (ref 39.0–52.0)
Hemoglobin: 13.4 g/dL (ref 13.0–17.0)
MCH: 35.3 pg — ABNORMAL HIGH (ref 26.0–34.0)
MCHC: 36.1 g/dL — ABNORMAL HIGH (ref 30.0–36.0)
MCV: 97.6 fL (ref 80.0–100.0)
Platelets: 106 10*3/uL — ABNORMAL LOW (ref 150–400)
RBC: 3.8 MIL/uL — ABNORMAL LOW (ref 4.22–5.81)
RDW: 12 % (ref 11.5–15.5)
WBC: 10.2 10*3/uL (ref 4.0–10.5)
nRBC: 0 % (ref 0.0–0.2)

## 2020-10-18 MED ORDER — OXYCODONE-ACETAMINOPHEN 5-325 MG PO TABS
1.0000 | ORAL_TABLET | Freq: Four times a day (QID) | ORAL | 0 refills | Status: DC | PRN
Start: 2020-10-18 — End: 2021-03-27

## 2020-10-18 MED ORDER — ATORVASTATIN CALCIUM 20 MG PO TABS: 20.0000 mg | ORAL_TABLET | Freq: Every day | ORAL | 6 refills | Status: DC

## 2020-10-18 NOTE — Progress Notes (Signed)
Pt discharged home with wife. AVS reviewed with pt and all questions were answered.

## 2020-10-18 NOTE — Progress Notes (Addendum)
  Progress Note    10/18/2020 7:39 AM 1 Day Post-Op  Subjective:  No complaints this morning   Vitals:   10/18/20 0300 10/18/20 0406  BP: 120/63 103/61  Pulse: 66 71  Resp: 17 17  Temp:  97.8 F (36.6 C)  SpO2: 94% 99%   Physical Exam: Lungs:  Non labored Incisions:  B groin access sites without hematoma Extremities:  Palpable R DP pulse; unable to palpate L pedal pulses but foot is warm to touch with good cap refill Abdomen:  Soft, NT Neurologic: A&O  CBC    Component Value Date/Time   WBC 10.2 10/18/2020 0109   RBC 3.80 (L) 10/18/2020 0109   HGB 13.4 10/18/2020 0109   HCT 37.1 (L) 10/18/2020 0109   PLT 106 (L) 10/18/2020 0109   MCV 97.6 10/18/2020 0109   MCH 35.3 (H) 10/18/2020 0109   MCHC 36.1 (H) 10/18/2020 0109   RDW 12.0 10/18/2020 0109   LYMPHSABS 1.3 11/09/2017 0943   MONOABS 0.4 11/09/2017 0943   EOSABS 0.2 11/09/2017 0943   BASOSABS 0.0 11/09/2017 0943    BMET    Component Value Date/Time   NA 137 10/18/2020 0109   NA 140 07/20/2020 0915   K 4.2 10/18/2020 0109   CL 107 10/18/2020 0109   CO2 19 (L) 10/18/2020 0109   GLUCOSE 164 (H) 10/18/2020 0109   BUN 14 10/18/2020 0109   BUN 10 07/20/2020 0915   CREATININE 0.88 10/18/2020 0109   CALCIUM 8.7 (L) 10/18/2020 0109   GFRNONAA >60 10/18/2020 0109   GFRAA 94 07/20/2020 0915    INR    Component Value Date/Time   INR 1.2 10/17/2020 1100     Intake/Output Summary (Last 24 hours) at 10/18/2020 0739 Last data filed at 10/18/2020 0349 Gross per 24 hour  Intake 2163.33 ml  Output 50 ml  Net 2113.33 ml     Assessment/Plan:  80 y.o. male is s/p endovascular repair of R CIA aneurysm 1 Day Post-Op   BLE well perfused Groin access sites without hematoma Home this morning if voiding   Dagoberto Ligas, PA-C [facility] [phone] 10/18/2020 7:39 AM  I agree with the above.  I have seen and evaluated patient.  He is postoperative #1, status post repair of right common  iliac artery aneurysm.  He is ready for discharge.  Annamarie Major

## 2020-10-18 NOTE — Discharge Summary (Signed)
EVAR Discharge Summary   Anthony Mendez Mar 16, 1941 80 y.o. male  MRN: [id number]  Admission Date: 10/17/2020  Discharge Date: 10/18/20  Physician: Dr. Trula Slade  Admission Diagnosis: Iliac aneurysm [facility]) [I72.3]  Discharge Day services:   See progress note 10/18/2020  Hospital Course:  [name] is a 80 year old male who was brought in as an outpatient for endovascular repair of right common iliac artery aneurysm using a iliac branch device.  This was performed by Dr. Trula Slade on 10/17/2020 and patient tolerated the procedure well.  He was admitted to the hospital postoperatively.  POD #1 renal function remained stable and he was able to void.  Bilateral lower extremities were well perfused and patient was ready for discharge home.  He will be discharged on aspirin and statin to take daily.  He will also be prescribed several days of narcotic pain medication for continued postoperative pain control.  He will follow-up in 4 weeks with a CTA abdomen/pelvis with Dr. Trula Slade.  Discharge instructions were reviewed with the patient and he voices understanding.  He was discharged home in stable condition.  CBC    Component Value Date/Time   WBC 10.2 10/18/2020 0109   RBC 3.80 (L) 10/18/2020 0109   HGB 13.4 10/18/2020 0109   HCT 37.1 (L) 10/18/2020 0109   PLT 106 (L) 10/18/2020 0109   MCV 97.6 10/18/2020 0109   MCH 35.3 (H) 10/18/2020 0109   MCHC 36.1 (H) 10/18/2020 0109   RDW 12.0 10/18/2020 0109   LYMPHSABS 1.3 11/09/2017 0943   MONOABS 0.4 11/09/2017 0943   EOSABS 0.2 11/09/2017 0943   BASOSABS 0.0 11/09/2017 0943    BMET    Component Value Date/Time   NA 137 10/18/2020 0109   NA 140 07/20/2020 0915   K 4.2 10/18/2020 0109   CL 107 10/18/2020 0109   CO2 19 (L) 10/18/2020 0109   GLUCOSE 164 (H) 10/18/2020 0109   BUN 14 10/18/2020 0109   BUN 10 07/20/2020 0915   CREATININE 0.88 10/18/2020 0109   CALCIUM 8.7 (L) 10/18/2020 0109   GFRNONAA >60 10/18/2020 0109    GFRAA 94 07/20/2020 0915         Discharge Diagnosis:  Iliac aneurysm ([place]) [I72.3]  Secondary Diagnosis: Patient Active Problem List   Diagnosis Date Noted  . Iliac aneurysm ([place]) 10/17/2020  . Vitreomacular traction syndrome, left 09/18/2020  . Cystoid macular edema, right eye 08/16/2020  . Amaurosis fugax of right eye 05/17/2020  . Right epiretinal membrane 03/22/2020  . Left epiretinal membrane 03/22/2020  . Tinnitus 10/22/2015  . BPH associated with nocturia 10/22/2015  . Routine general medical examination at a health care facility 04/22/2012  . Hearing loss 06/19/2009   Past Medical History:  Diagnosis Date  . Essential hypertension 11/11/2016  . HEARING LOSS, BILATERAL 06/19/2009  . History of solar dermatitis   . Hyperlipidemia 11/11/2016     Allergies as of 10/18/2020   No Known Allergies     Medication List    TAKE these medications   aspirin EC 81 MG tablet Take 81 mg by mouth daily. Swallow whole.   atorvastatin 20 MG tablet Commonly known as: LIPITOR Take 1 tablet (20 mg total) by mouth daily.   losartan 25 MG tablet Commonly known as: COZAAR Take 1 tablet (25 mg total) by mouth daily.   Lubricant Eye Drops 0.4-0.3 % Soln Generic drug: Polyethyl Glycol-Propyl Glycol Place 1 drop into both eyes 3 (three) times daily as needed (dry/irritated eyes.).  oxyCODONE-acetaminophen 5-325 MG tablet Commonly known as: PERCOCET/ROXICET Take 1 tablet by mouth every 6 (six) hours as needed for moderate pain.   VITAMIN D3 PO Take 1 tablet by mouth daily.       Discharge Instructions:   [facility]  Discharge Instructions Endovascular Aortic Aneurysm Repair  Please refer to the following instructions for your post-procedure care. Your surgeon or Physician Assistant will discuss any changes with you.  Activity  You are encouraged to walk as much as you can. You can slowly return to normal activities but must avoid  strenuous activity and heavy lifting until your doctor tells you it's OK. Avoid activities such as vacuuming or swinging a gold club. It is normal to feel tired for several weeks after your surgery. Do not drive until your doctor gives the OK and you are no longer taking prescription pain medications. It is also normal to have difficulty with sleep habits, eating, and bowel movements after surgery. These will go away with time.  Bathing/Showering  You may shower after you go home. If you have an incision, do not soak in a bathtub, hot tub, or swim until the incision heals completely.  Incision Care  Shower every day. Clean your incision with mild soap and water. Pat the area dry with a clean towel. You do not need a bandage unless otherwise instructed. Do not apply any ointments or creams to your incision. If you clothing is irritating, you may cover your incision with a dry gauze pad.  Diet  Resume your normal diet. There are no special food restrictions following this procedure. A low fat/low cholesterol diet is recommended for all patients with vascular disease. In order to heal from your surgery, it is CRITICAL to get adequate nutrition. Your body requires vitamins, minerals, and protein. Vegetables are the best source of vitamins and minerals. Vegetables also provide the perfect balance of protein. Processed food has little nutritional value, so try to avoid this.  Medications  Resume taking all of your medications unless your doctor or Physician Assistnat tells you not to. If your incision is causing pain, you may take over-the-counter pain relievers such as acetaminophen (Tylenol). If you were prescribed a stronger pain medication, please be aware these medications can cause nausea and constipation. Prevent nausea by taking the medication with a snack or meal. Avoid constipation by drinking plenty of fluids and eating foods with a high amount of fiber, such as fruits, vegetables, and grains.  Do not take Tylenol if you are taking prescription pain medications.   Follow up  [place] office will schedule a follow-up appointment with a C.T. scan 3-4 weeks after your surgery.  Please call us immediately for any of the following conditions  . Severe or worsening pain in your legs or feet or in your abdomen back or chest. . Increased pain, redness, drainage (pus) from your incision sit. . Increased abdominal pain, bloating, nausea, vomiting or persistent diarrhea. . Fever of 101 degrees or higher. . Swelling in your leg (s), .  Reduce your risk of vascular disease  .Stop smoking. If you would like help call [facility] at 1-800-QUIT-NOW 905-104-1137) or [place] at (315)[phone]. .Manage your cholesterol .Maintain a desired weight .Control your diabetes .Keep your blood pressure down  If you have questions, please call the office at [phone].   Disposition: home  Patient's condition: is Good  Follow up: 1. Dr. Trula Slade in 4 weeks with CTA protocol   Dagoberto Ligas, PA-C  [facility] [phone] 10/18/2020  1:37 PM   - For VQI Registry use - Post-op:  Time to Extubation: [x]  In OR, [ ]  < 12 hrs, [ ]  12-24 hrs, [ ]  >=24 hrs Vasopressors Req. Post-op: No MI: No., [ ]  Troponin only, [ ]  EKG or Clinical New Arrhythmia: No CHF: No ICU Stay: 0 days Transfusion: No       Complications: Resp failure: No., [ ]  Pneumonia, [ ]  Ventilator Chg in renal function: No., [ ]  Inc. Cr > 0.5, [ ]  Temp. Dialysis,  [ ]  Permanent dialysis Leg ischemia: No., no Surgery needed, [ ]  Yes, Surgery needed,  [ ]  Amputation Bowel ischemia: No., [ ]  Medical Rx, [ ]  Surgical Rx Wound complication: No., [ ]  Superficial separation/infection, [ ]  Return to OR Return to OR: No  Return to OR for bleeding: No Stroke: No., [ ]  Minor, [ ]  Major  Discharge medications: Statin use:  Yes  ASA use:  Yes  Plavix use:  No  Beta blocker use:  No  ARB use:  Yes ACEI use:   No CCB use:  No

## 2020-11-15 ENCOUNTER — Ambulatory Visit
Admission: RE | Admit: 2020-11-15 | Discharge: 2020-11-15 | Disposition: A | Discharge status: Home / Self Care | Payer: Medicare Other | Source: Ambulatory Visit | Attending: Surgery | Admitting: Surgery

## 2020-11-15 DIAGNOSIS — N281 Cyst of kidney, acquired: Secondary | ICD-10-CM | POA: Diagnosis not present

## 2020-11-15 DIAGNOSIS — I723 Aneurysm of iliac artery: Secondary | ICD-10-CM | POA: Diagnosis not present

## 2020-11-15 DIAGNOSIS — I708 Atherosclerosis of other arteries: Secondary | ICD-10-CM | POA: Diagnosis not present

## 2020-11-15 DIAGNOSIS — I745 Embolism and thrombosis of iliac artery: Secondary | ICD-10-CM | POA: Diagnosis not present

## 2020-11-15 MED ORDER — IOPAMIDOL (ISOVUE-370) INJECTION 76%
75.0000 mL | Freq: Once | INTRAVENOUS | Status: AC | PRN
  Administered 2020-11-15: 75 mL via INTRAVENOUS

## 2020-11-19 ENCOUNTER — Encounter: Payer: Medicare Other | Admitting: Surgery

## 2020-11-23 ENCOUNTER — Telehealth: Payer: Self-pay | Admitting: Family Medicine

## 2020-11-23 NOTE — Telephone Encounter (Signed)
Left message for patient to call back and schedule Medicare Annual Wellness Visit (AWV) either virtually or in office. No detailed message left   Last AWV no information  please schedule at anytime with [facility] Nurse Health Advisor 1 or 2   This should be a 45 minute visit. 

## 2020-11-29 ENCOUNTER — Other Ambulatory Visit: Payer: Self-pay | Admitting: Internal Medicine

## 2020-11-29 DIAGNOSIS — I10 Essential (primary) hypertension: Secondary | ICD-10-CM

## 2020-12-04 ENCOUNTER — Other Ambulatory Visit: Payer: Self-pay

## 2020-12-04 DIAGNOSIS — I10 Essential (primary) hypertension: Secondary | ICD-10-CM

## 2020-12-04 MED ORDER — LOSARTAN POTASSIUM 25 MG PO TABS: 25.0000 mg | ORAL_TABLET | Freq: Every day | ORAL | 3 refills | Status: DC

## 2020-12-10 ENCOUNTER — Ambulatory Visit (INDEPENDENT_AMBULATORY_CARE_PROVIDER_SITE_OTHER): Payer: Medicare Other | Admitting: Surgery

## 2020-12-10 ENCOUNTER — Encounter: Payer: Self-pay | Admitting: Surgery

## 2020-12-10 ENCOUNTER — Other Ambulatory Visit: Payer: Self-pay

## 2020-12-10 VITALS — BP 108/73 | HR 72 | Temp 98.1°F | Resp 20 | Ht 71.5 in | Wt 184.0 lb

## 2020-12-10 DIAGNOSIS — I723 Aneurysm of iliac artery: Secondary | ICD-10-CM

## 2020-12-10 NOTE — Progress Notes (Signed)
Patient name: Anthony Mendez MRN: [id number] DOB: 1940-11-29 Sex: male  REASON FOR VISIT:     postop   HISTORY OF PRESENT ILLNESS:   Anthony Mendez is a 80 y.o. male returns today for his first postoperative visit.  On 10/17/2020 he underwent endovascular repair of a 3.6 cm right common iliac aneurysm using a iliac branch device.  His postoperative course was uncomplicated.  He has a known 4.7 cm ascending aortic aneurysm which is being followed by Dr. Cyndia Bent in cardiology.  He has a history of amaurosis fugax in the right eye of unknown etiology.  His carotid arteries are widely patent on ultrasound.  He takes a statin for hypercholesterolemia.  He is medically managed for hypertension with an ARB.  He is on a baby aspirin.  CURRENT MEDICATIONS:    Current Outpatient Medications  Medication Sig Dispense Refill  . aspirin EC 81 MG tablet Take 81 mg by mouth daily. Swallow whole.    Marland Kitchen atorvastatin (LIPITOR) 20 MG tablet Take 1 tablet (20 mg total) by mouth daily. 30 tablet 6  . Cholecalciferol (VITAMIN D3 PO) Take 1 tablet by mouth daily.    Marland Kitchen losartan (COZAAR) 25 MG tablet Take 1 tablet (25 mg total) by mouth daily. 90 tablet 3  . Polyethyl Glycol-Propyl Glycol (LUBRICANT EYE DROPS) 0.4-0.3 % SOLN Place 1 drop into both eyes 3 (three) times daily as needed (dry/irritated eyes.).    Marland Kitchen oxyCODONE-acetaminophen (PERCOCET/ROXICET) 5-325 MG tablet Take 1 tablet by mouth every 6 (six) hours as needed for moderate pain. (Patient not taking: Reported on 12/10/2020) 12 tablet 0   No current facility-administered medications for this visit.    REVIEW OF SYSTEMS:   [X]  denotes positive finding, [ ]  denotes negative finding Cardiac  Comments:  Chest pain or chest pressure:    Shortness of breath upon exertion:    Short of breath when lying flat:    Irregular heart rhythm:    Constitutional    Fever or chills:      PHYSICAL EXAM:   Vitals:   12/10/20 1517   BP: 108/73  Pulse: 72  Resp: 20  Temp: 98.1 F (36.7 C)  SpO2: 95%  Weight: 184 lb (83.5 kg)  Height: 5' 11.5" (1.816 m)    GENERAL: The patient is a well-nourished male, in no acute distress. The vital signs are documented above. CARDIOVASCULAR: There is a regular rate and rhythm. PULMONARY: Non-labored respirations Both groin incisions are well-healed  STUDIES:   VASCULAR  Status post bifurcated stent graft repair of the right distal common iliac artery aneurysm. Decrease in native aneurysm sac diameter. Measurements as above. No complicating features.  Stable 2 cm aneurysm of the left distal common iliac artery.  Aortic Atherosclerosis (ICD10-I70.0).  No other acute intra-abdominal or pelvic vascular finding.  NON-VASCULAR  No other acute intra-abdominal or pelvic non vascular finding. Stable chronic findings as above.    MEDICAL ISSUES:   Bilateral iliac aneurysms: The patient is status post endovascular repair of a right common iliac aneurysm measuring 3.7 cm.  This was done using a iliac branch device.  CT scan today shows that the stent is in good position without evidence of endoleak.  The maximum diameter of the iliac aneurysm has decreased to 3.5 cm.  The contralateral left common iliac aneurysm measures 2 cm.  I have scheduled him for follow-up ultrasound evaluation in 6 months  Leia Alf, MD, FACS [facility] [phone])  [phone] Pager [phone]

## 2020-12-11 ENCOUNTER — Other Ambulatory Visit: Payer: Self-pay

## 2020-12-11 DIAGNOSIS — I723 Aneurysm of iliac artery: Secondary | ICD-10-CM

## 2020-12-31 ENCOUNTER — Telehealth: Payer: Self-pay | Admitting: Internal Medicine

## 2020-12-31 NOTE — Telephone Encounter (Signed)
Returned the call to the patient's wife per the DPR. She stated that they are back in town and ready to wear the cardiac monitor. She stated that the one ordered previously was not correct. It was a 30 day monitor and he was not comfortable with having to wear the monitor and  carry the phone that comes with it. The zio monitor was explained to the wife and she stated that may be better but she was still hesitant feeling like her husband may be too active for it but they would be willing to try it.

## 2020-12-31 NOTE — Telephone Encounter (Signed)
Patient's wife states they were supposed to call when they were back in town to request a heart monitor. She states one came before christmas, but it was not the right one and it was sent back. She would like a call back when the order is put in.

## 2021-01-10 ENCOUNTER — Telehealth: Payer: Self-pay | Admitting: Internal Medicine

## 2021-01-10 NOTE — Telephone Encounter (Signed)
Called to discuss scheduling the CTA chest/aorta ordered by Dr. Jodi Mourning will be ina an dout of town for the next few months and will call back to schedule

## 2021-01-23 ENCOUNTER — Other Ambulatory Visit: Payer: Self-pay | Admitting: Surgery

## 2021-01-23 DIAGNOSIS — I712 Thoracic aortic aneurysm, without rupture, unspecified: Secondary | ICD-10-CM

## 2021-02-03 ENCOUNTER — Encounter (HOSPITAL_COMMUNITY): Payer: Self-pay | Admitting: Emergency Medicine

## 2021-02-03 ENCOUNTER — Ambulatory Visit (HOSPITAL_COMMUNITY)
Admission: EM | Admit: 2021-02-03 | Discharge: 2021-02-03 | Disposition: A | Discharge status: Home / Self Care | Payer: Medicare Other | Attending: Emergency Medicine | Admitting: Emergency Medicine

## 2021-02-03 DIAGNOSIS — J069 Acute upper respiratory infection, unspecified: Secondary | ICD-10-CM

## 2021-02-03 MED ORDER — ACETAMINOPHEN 325 MG PO TABS
650.0000 mg | ORAL_TABLET | Freq: Once | ORAL | Status: AC
  Administered 2021-02-03: 650 mg via ORAL

## 2021-02-03 MED ORDER — ACETAMINOPHEN 325 MG PO TABS
ORAL_TABLET | ORAL | Status: AC
  Filled 2021-02-03: qty 2
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
## 2021-02-03 NOTE — ED Triage Notes (Signed)
Pt presents with congestion, cough, fever, evey drainage, and sore throat xs 4-5 days.

## 2021-02-03 NOTE — Discharge Instructions (Addendum)
You most likely have viral illness.   You can take Tylenol and/or Ibuprofen as needed for fever reduction and pain relief. You can also take Aleve but do not mix Advil and Aleve.    For cough: honey 1/2 to 1 teaspoon (you can dilute the honey in water or another fluid).  You can also use guaifenesin and dextromethorphan for cough. You can use a humidifier for chest congestion and cough.  If you don't have a humidifier, you can sit in the bathroom with the hot shower running.     For sore throat: try warm salt water gargles, cepacol lozenges, throat spray, warm tea or water with lemon/honey, popsicles or ice, or OTC cold relief medicine for throat discomfort.    For congestion: take a daily anti-histamine like Zyrtec, Claritin, and a oral decongestant, such as pseudoephedrine.  You can also use Flonase 1-2 sprays in each nostril daily.    It is important to stay hydrated: drink plenty of fluids (water, gatorade/powerade/pedialyte, juices, or teas) to keep your throat moisturized and help further relieve irritation/discomfort.   Return or go to the Emergency Department if symptoms worsen or do not improve in the next few days.

## 2021-02-05 ENCOUNTER — Telehealth (INDEPENDENT_AMBULATORY_CARE_PROVIDER_SITE_OTHER): Payer: Medicare Other | Admitting: Family Medicine

## 2021-02-05 ENCOUNTER — Other Ambulatory Visit: Payer: Self-pay

## 2021-02-05 DIAGNOSIS — J019 Acute sinusitis, unspecified: Secondary | ICD-10-CM | POA: Diagnosis not present

## 2021-02-05 MED ORDER — AMOXICILLIN-POT CLAVULANATE 875-125 MG PO TABS: 1.0000 | ORAL_TABLET | Freq: Two times a day (BID) | ORAL | 0 refills | Status: DC
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
## 2021-02-13 ENCOUNTER — Telehealth: Payer: Self-pay | Admitting: Family Medicine

## 2021-02-13 NOTE — Telephone Encounter (Signed)
Tried calling pt to  schedule Medicare Annual Wellness Visit (AWV) either virtually or in office. Before his appointment with pcp on 02/22/21  No answer    AWV-I PER PALMETTO 09/08/09    please schedule at anytime with [facility] Nurse Health Advisor 1 or 2   This should be a 45 minute visit.

## 2021-02-22 ENCOUNTER — Ambulatory Visit (INDEPENDENT_AMBULATORY_CARE_PROVIDER_SITE_OTHER): Payer: Medicare Other | Admitting: Family Medicine

## 2021-02-22 ENCOUNTER — Other Ambulatory Visit: Payer: Self-pay

## 2021-02-22 ENCOUNTER — Encounter: Payer: Self-pay | Admitting: Family Medicine

## 2021-02-22 VITALS — BP 138/80 | HR 80 | Temp 97.7°F | Ht 71.5 in | Wt 181.8 lb

## 2021-02-22 DIAGNOSIS — Z0000 Encounter for general adult medical examination without abnormal findings: Secondary | ICD-10-CM | POA: Diagnosis not present

## 2021-02-22 DIAGNOSIS — H663X2 Other chronic suppurative otitis media, left ear: Secondary | ICD-10-CM

## 2021-02-22 DIAGNOSIS — E785 Hyperlipidemia, unspecified: Secondary | ICD-10-CM | POA: Diagnosis not present

## 2021-02-22 LAB — BASIC METABOLIC PANEL
BUN: 11 mg/dL (ref 6–23)
CO2: 27 mEq/L (ref 19–32)
Calcium: 9.4 mg/dL (ref 8.4–10.5)
Chloride: 103 mEq/L (ref 96–112)
Creatinine, Ser: 0.86 mg/dL (ref 0.40–1.50)
GFR: 81.82 mL/min (ref >60.00)
Glucose, Bld: 92 mg/dL (ref 70–99)
Potassium: 4 mEq/L (ref 3.5–5.1)
Sodium: 138 mEq/L (ref 135–145)

## 2021-02-22 LAB — HEPATIC FUNCTION PANEL
ALT: 17 U/L (ref 0–53)
AST: 16 U/L (ref 0–37)
Albumin: 4.1 g/dL (ref 3.5–5.2)
Alkaline Phosphatase: 60 U/L (ref 39–117)
Bilirubin, Direct: 0.1 mg/dL (ref 0.0–0.3)
Total Bilirubin: 0.8 mg/dL (ref 0.2–1.2)
Total Protein: 7 g/dL (ref 6.0–8.3)

## 2021-02-22 LAB — LIPID PANEL
Cholesterol: 206 mg/dL — ABNORMAL HIGH (ref 0–200)
HDL: 50.2 mg/dL (ref >39.00)
LDL Cholesterol: 131 mg/dL — ABNORMAL HIGH (ref 0–99)
NonHDL: 155.59
Total CHOL/HDL Ratio: 4
Triglycerides: 122 mg/dL (ref 0.0–149.0)
VLDL: 24.4 mg/dL (ref 0.0–40.0)

## 2021-02-22 MED ORDER — AMOXICILLIN-POT CLAVULANATE 875-125 MG PO TABS: 1.0000 | ORAL_TABLET | Freq: Two times a day (BID) | ORAL | 0 refills | Status: DC

## 2021-02-22 NOTE — Patient Instructions (Signed)
Consider Shingrix vaccine.
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
## 2021-02-25 ENCOUNTER — Encounter: Payer: Medicare Other | Admitting: Family Medicine

## 2021-02-27 ENCOUNTER — Ambulatory Visit: Payer: Medicare Other | Admitting: Surgery

## 2021-02-27 ENCOUNTER — Other Ambulatory Visit: Payer: Medicare Other

## 2021-03-19 ENCOUNTER — Other Ambulatory Visit: Payer: Self-pay

## 2021-03-19 ENCOUNTER — Ambulatory Visit
Admission: RE | Admit: 2021-03-19 | Discharge: 2021-03-19 | Disposition: A | Discharge status: Home / Self Care | Payer: Medicare Other | Source: Ambulatory Visit | Attending: Internal Medicine | Admitting: Internal Medicine

## 2021-03-19 DIAGNOSIS — I712 Thoracic aortic aneurysm, without rupture, unspecified: Secondary | ICD-10-CM

## 2021-03-19 DIAGNOSIS — I2510 Atherosclerotic heart disease of native coronary artery without angina pectoris: Secondary | ICD-10-CM | POA: Diagnosis not present

## 2021-03-19 DIAGNOSIS — I517 Cardiomegaly: Secondary | ICD-10-CM | POA: Diagnosis not present

## 2021-03-19 DIAGNOSIS — J929 Pleural plaque without asbestos: Secondary | ICD-10-CM | POA: Diagnosis not present

## 2021-03-19 MED ORDER — IOPAMIDOL (ISOVUE-370) INJECTION 76%
75.0000 mL | Freq: Once | INTRAVENOUS | Status: AC | PRN
  Administered 2021-03-19: 75 mL via INTRAVENOUS

## 2021-03-25 ENCOUNTER — Telehealth: Payer: Self-pay | Admitting: Family Medicine

## 2021-03-25 NOTE — Telephone Encounter (Signed)
Spoke with spouse to schedule AWV She stated when patient was in to see dr [name] 02/22/21 this was done along with cpe.  Only billed for cpe  can pt have awv

## 2021-03-27 ENCOUNTER — Other Ambulatory Visit: Payer: Self-pay

## 2021-03-27 ENCOUNTER — Ambulatory Visit: Payer: Medicare Other | Admitting: Surgery

## 2021-03-27 ENCOUNTER — Encounter: Payer: Self-pay | Admitting: Surgery

## 2021-03-27 VITALS — BP 159/85 | HR 76 | Resp 20 | Ht 71.5 in | Wt 182.0 lb

## 2021-03-27 DIAGNOSIS — I712 Thoracic aortic aneurysm, without rupture, unspecified: Secondary | ICD-10-CM

## 2021-03-27 NOTE — Progress Notes (Signed)
HPI:  The patient is an 80 year old gentleman who returns for follow-up of a 4.7 cm fusiform ascending aortic aneurysm with a trileaflet aortic valve on echocardiogram with visually mild aortic insufficiency that was read as moderate by AI pressure half-time.  He had mild calcification of the aortic valve but no evidence of stenosis.  His aortic root was normal size.  Left ventricular systolic function was normal.  Left ventricular internal dimensions were normal.  He was also found to have a right common iliac artery aneurysm which was successfully treated with endovascular repair by Dr. Trula Slade in February 2022.  He continues to feel well without chest pain or shortness of breath.  Current Outpatient Medications  Medication Sig Dispense Refill   aspirin EC 81 MG tablet Take 81 mg by mouth daily. Swallow whole.     Cholecalciferol (VITAMIN D3 PO) Take 1 tablet by mouth daily.     atorvastatin (LIPITOR) 20 MG tablet Take 1 tablet (20 mg total) by mouth daily. (Patient not taking: Reported on 03/27/2021) 30 tablet 6   losartan (COZAAR) 25 MG tablet Take 1 tablet (25 mg total) by mouth daily. (Patient not taking: Reported on 03/27/2021) 90 tablet 3   Polyethyl Glycol-Propyl Glycol (LUBRICANT EYE DROPS) 0.4-0.3 % SOLN Place 1 drop into both eyes 3 (three) times daily as needed (dry/irritated eyes.). (Patient not taking: Reported on 03/27/2021)     No current facility-administered medications for this visit.     Physical Exam: BP (!) 159/85 (BP Location: Right Arm, Patient Position: Sitting)   Pulse 76   Resp 20   Ht 5' 11.5" (1.816 m)   Wt 182 lb (82.6 kg)   SpO2 94% Comment: RA  BMI 25.03 kg/m  He looks well. Cardiac exam shows a regular rate and rhythm.  There is no murmur Lung exam is clear.  Diagnostic Tests:  Narrative & Impression  CLINICAL DATA:  Follow-up TAA   EXAM: CT ANGIOGRAPHY CHEST WITH CONTRAST   TECHNIQUE: Multidetector CT imaging of the chest was performed using  the standard protocol during bolus administration of intravenous contrast. Multiplanar CT image reconstructions and MIPs were obtained to evaluate the vascular anatomy.   CONTRAST:  33mL ISOVUE-370 IOPAMIDOL (ISOVUE-370) INJECTION 76%   COMPARISON:  07/26/2020   FINDINGS: Cardiovascular: Preferential opacification of the thoracic aorta. Unchanged aneurysm of the tubular ascending thoracic aorta measuring up to 4.8 x 4.7 cm. The aortic valve measures 2.2 cm in caliber. The sinuses of Valsalva measure 3.2 cm. The descending thoracic aorta is normal in caliber measuring up to 2.5 x 2.4 cm. Mild mixed calcific atherosclerosis. Mild cardiomegaly. Left coronary artery calcifications. No pericardial effusion.   Mediastinum/Nodes: No enlarged mediastinal, hilar, or axillary lymph nodes. Thyroid gland, trachea, and esophagus demonstrate no significant findings.   Lungs/Pleura: Diffuse bilateral bronchial wall thickening. Minimal dependent scarring and or partial atelectasis. No pleural effusion or pneumothorax.   Upper Abdomen: No acute abnormality.   Musculoskeletal: No chest wall abnormality. No acute or significant osseous findings.   Review of the MIP images confirms the above findings.   IMPRESSION: 1. Unchanged aneurysm of the tubular ascending thoracic aorta measuring up to 4.8 x 4.7 cm. Ascending thoracic aortic aneurysm. Recommend semi-annual imaging followup by CTA or MRA and referral to cardiothoracic surgery if not already obtained. This recommendation follows 2010 ACCF/AHA/AATS/ACR/ASA/SCA/SCAI/SIR/STS/SVM Guidelines for the Diagnosis and Management of Patients With Thoracic Aortic Disease. Circulation. 2010; 121: B048-G891. Aortic aneurysm NOS (ICD10-I71.9) 2. Mild mixed calcific atherosclerosis. 3. Coronary  artery disease. 4. Diffuse bilateral bronchial wall thickening, consistent with nonspecific infectious or inflammatory bronchitis.   Aortic Atherosclerosis  (ICD10-I70.0).     Electronically Signed   By: Eddie Candle M.D.   On: 03/21/2021 13:30      Impression:  He has a stable 4.7 x 4.8 cm fusiform ascending aortic aneurysm with a trileaflet aortic valve and mild to moderate aortic insufficiency.  His aortic root is normal size with a sinus diameter 3.2 cm and a normal sinotubular junction.  His aorta returns normal size in the distal ascending aorta just proximal to the innominate artery.  His aneurysm is below the surgical threshold of 5.5 cm.  I reviewed the CTA images with him and his wife and answered all their questions.  I stressed the importance of continued good blood pressure control in preventing further enlargement and acute aortic dissection.  I encouraged him to continue exercising but asked him to avoid heavy lifting that might require a Valsalva maneuver.  Plan:  I will plan to see him back in 6 months with a CTA of the chest.  I spent 20 minutes performing this established patient evaluation and > 50% of this time was spent face to face counseling and coordinating the care of this patient's aortic aneurysm.    Gaye Pollack, MD [facility] [phone]

## 2021-04-19 NOTE — Telephone Encounter (Signed)
Attempted to call patient, left message for patient to call back to office.   Did he ever wear the monitor? Please find out and order whatever monitor he feels comfortable wearing (14 day is fine).  GA

## 2021-04-25 ENCOUNTER — Telehealth: Payer: Self-pay | Admitting: Family Medicine

## 2021-04-25 NOTE — Telephone Encounter (Signed)
Left message for patient to call back and schedule Medicare Annual Wellness Visit (AWV) either virtually or in office. Left  my Anthony Mendez number [phone]    AWV-I PER PALMETTO 09/08/09   please schedule at anytime with [facility] Nurse Health Advisor 1 or 2   Per phone note 03/25/21 from dawn h  patient can have awv  This should be a 45 minute visit.

## 2021-05-17 NOTE — Telephone Encounter (Signed)
Called and spoke with patient wife (okay per DPR) due to not being able to reach patient. Per patient's wife patient had decided that he did not want to wear a heart monitor. Per patient's wife he is not having any symptoms. Advised patient's wife to return call to office with any issues, questions, or concerns. Patients wife verbalized understanding.

## 2021-06-06 NOTE — Telephone Encounter (Signed)
Attempted to call patient regarding the following message, left message to call back when able.      Ok, the monitor was intended to screen for atrial fibrillation with the episode of amaurosis fugax he had. I would still recommend when he is ready to wear it, since afib may be asymptomatic.   Please make sure he has a recall for follow up, I can see him sometime in the spring/summer depending on his schedule.  GA

## 2021-06-26 ENCOUNTER — Telehealth: Payer: Self-pay | Admitting: Family Medicine

## 2021-06-26 NOTE — Telephone Encounter (Signed)
Left message for patient to call back and schedule Medicare Annual Wellness Visit (AWV) either virtually or in office. Left  my Herbie Drape number [phone]    AWV-I PER PALMETTO 09/08/09   please schedule at anytime with [facility] Nurse Health Advisor 1 or 2   This should be a 45 minute visit.

## 2021-07-25 NOTE — Telephone Encounter (Signed)
Sent patient a MyChart message with Dr. Delphina Cahill recommendations.

## 2021-08-08 ENCOUNTER — Telehealth: Payer: Self-pay | Admitting: Family Medicine

## 2021-08-08 NOTE — Telephone Encounter (Signed)
Left message for patient to call back and schedule Medicare Annual Wellness Visit (AWV) either virtually or in office. Left  my Anthony Mendez number [phone]   Last AWV ;04/22/12 please schedule at anytime with [facility] Nurse Health Advisor 1 or 2   This should be a 45 minute visit.

## 2021-08-12 ENCOUNTER — Other Ambulatory Visit: Payer: Self-pay | Admitting: Surgery

## 2021-08-12 DIAGNOSIS — I7121 Aneurysm of the ascending aorta, without rupture: Secondary | ICD-10-CM

## 2021-09-17 ENCOUNTER — Other Ambulatory Visit: Payer: Self-pay

## 2021-09-17 ENCOUNTER — Encounter (INDEPENDENT_AMBULATORY_CARE_PROVIDER_SITE_OTHER): Payer: Self-pay | Admitting: Ophthalmology

## 2021-09-17 ENCOUNTER — Ambulatory Visit (INDEPENDENT_AMBULATORY_CARE_PROVIDER_SITE_OTHER): Payer: Medicare Other | Admitting: Ophthalmology

## 2021-09-17 DIAGNOSIS — G453 Amaurosis fugax: Secondary | ICD-10-CM | POA: Diagnosis not present

## 2021-09-17 DIAGNOSIS — H43822 Vitreomacular adhesion, left eye: Secondary | ICD-10-CM

## 2021-09-17 DIAGNOSIS — H35371 Puckering of macula, right eye: Secondary | ICD-10-CM

## 2021-09-17 DIAGNOSIS — H35372 Puckering of macula, left eye: Secondary | ICD-10-CM

## 2021-09-17 NOTE — Assessment & Plan Note (Signed)
Condition resolved OD 

## 2021-09-17 NOTE — Assessment & Plan Note (Signed)
Physiologic attachment OS, no foveal distortion.  Will observe.  Findings reviewed and disclosed to the patient such that if new onset floaters develop that this would be coincident with posterior vitreous detachment developing, a normal aging occurrence nonetheless requiring follow-up dilated examination
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
## 2021-09-17 NOTE — Assessment & Plan Note (Signed)
Clinically visible not impactful observe

## 2021-09-18 ENCOUNTER — Encounter: Payer: Self-pay | Admitting: Surgery

## 2021-09-18 ENCOUNTER — Ambulatory Visit
Admission: RE | Admit: 2021-09-18 | Discharge: 2021-09-18 | Disposition: A | Discharge status: Home / Self Care | Payer: Medicare Other | Source: Ambulatory Visit | Attending: Surgery | Admitting: Surgery

## 2021-09-18 ENCOUNTER — Ambulatory Visit: Payer: Medicare Other | Admitting: Surgery

## 2021-09-18 VITALS — BP 150/87 | HR 70 | Resp 20 | Ht 71.5 in | Wt 186.0 lb

## 2021-09-18 DIAGNOSIS — I7121 Aneurysm of the ascending aorta, without rupture: Secondary | ICD-10-CM

## 2021-09-18 DIAGNOSIS — I712 Thoracic aortic aneurysm, without rupture, unspecified: Secondary | ICD-10-CM | POA: Diagnosis not present

## 2021-09-18 DIAGNOSIS — I10 Essential (primary) hypertension: Secondary | ICD-10-CM | POA: Diagnosis not present

## 2021-09-18 DIAGNOSIS — I2510 Atherosclerotic heart disease of native coronary artery without angina pectoris: Secondary | ICD-10-CM | POA: Diagnosis not present

## 2021-09-18 MED ORDER — IOPAMIDOL (ISOVUE-370) INJECTION 76%
75.0000 mL | Freq: Once | INTRAVENOUS | Status: AC | PRN
  Administered 2021-09-18: 75 mL via INTRAVENOUS

## 2021-09-18 NOTE — Progress Notes (Signed)
HPI: The patient is an 81 year old gentleman who I have been following with a stable 4.7 cm fusiform ascending aortic aneurysm with a trileaflet aortic valve with visually mild aortic insufficiency that was read as moderate by AI pressure half-time.  I last saw him on 03/27/2021.  Since then he has continued to feel well and denies any chest or back pain.  He tells me that he stopped taking his losartan last year because after he was started on it his energy level dropped significantly and he felt lethargic all the time.  He stopped it on his own and said that he felt much better within 3 days.  He does check his blood pressure at home periodically and says it is under control most of the time. He was also found to have a right common iliac artery aneurysm which was successfully treated with endovascular repair by Dr. Trula Slade in February 2022.    Current Outpatient Medications  Medication Sig Dispense Refill   aspirin EC 81 MG tablet Take 81 mg by mouth daily. Swallow whole.     atorvastatin (LIPITOR) 20 MG tablet Take 1 tablet (20 mg total) by mouth daily. 30 tablet 6   Cholecalciferol (VITAMIN D3 PO) Take 1 tablet by mouth daily.     losartan (COZAAR) 25 MG tablet Take 1 tablet (25 mg total) by mouth daily. 90 tablet 3   Polyethyl Glycol-Propyl Glycol (LUBRICANT EYE DROPS) 0.4-0.3 % SOLN Place 1 drop into both eyes 3 (three) times daily as needed (dry/irritated eyes.).     No current facility-administered medications for this visit.     Physical Exam: BP (!) 150/87    Pulse 70    Resp 20    Ht 5' 11.5" (1.816 m)    Wt 186 lb (84.4 kg)    SpO2 94%    BMI 25.58 kg/m  He looks well. Cardiac exam shows regular rate and rhythm with normal heart sounds.  There is no murmur. Lungs are clear  Diagnostic Tests:  Narrative & Impression  CLINICAL DATA:  Follow-up thoracic aortic aneurysm.  Hypertension.   EXAM: CT ANGIOGRAPHY CHEST WITH CONTRAST   TECHNIQUE: Multidetector CT imaging of the  chest was performed using the standard protocol during bolus administration of intravenous contrast. Multiplanar CT image reconstructions and MIPs were obtained to evaluate the vascular anatomy.   RADIATION DOSE REDUCTION: This exam was performed according to the departmental dose-optimization program which includes automated exposure control, adjustment of the mA and/or kV according to patient size and/or use of iterative reconstruction technique.   CONTRAST:  49mL ISOVUE-370 IOPAMIDOL (ISOVUE-370) INJECTION 76%   COMPARISON:  CTA chest 03/19/2021   FINDINGS: Cardiovascular: There is preferential opacification of the ascending aorta. There is again aneurysmal dilatation of the tubular ascending thoracic aorta measuring up to 4.7 x 4.7 cm, not significant changed from 03/19/2021 and measured in a similar manner. The aortic root at the sinuses of Valsalva measures 3.2 cm, unchanged when measured in a similar manner. The aortic valve measures approximately 2.2 cm in caliber, unchanged. Left coronary artery calcifications are again seen. No pericardial effusion. Moderate cardiomegaly, unchanged.   Mild atherosclerosis within the thoracic aorta. No large central pulmonary embolism is seen.   Mediastinum/Nodes: No enlarged mediastinal, hilar, or axillary lymph nodes. Thyroid gland, trachea, and esophagus demonstrate no significant findings.   Lungs/Pleura: The central airways are patent. Mild scattered peripheral subpleural scarring is unchanged. No pleural effusion or pneumothorax.   Upper Abdomen: Low-attenuation exophytic right  upper pole 2.7 cm likely cyst is unchanged from prior.   Musculoskeletal: Mild levocurvature of the mid to lower thoracic spine with mild-to-moderate multilevel degenerative disc changes and anterior bridging endplate osteophytes.   Review of the MIP images confirms the above findings.   IMPRESSION:: IMPRESSION: 1. Stable ascending aortic aneurysm  measuring up to 4.7 cm. Recommend semi-annual imaging followup by CTA or MRA and referral to cardiothoracic surgery if not already obtained. This recommendation follows 2010 ACCF/AHA/AATS/ACR/ASA/SCA/SCAI/SIR/STS/SVM Guidelines for the Diagnosis and Management of Patients With Thoracic Aortic Disease. Circulation. 2010; 121: S962-E366. Aortic aneurysm NOS (ICD10-I71.9) 2. Coronary artery calcifications as can be seen with coronary artery disease.     Electronically Signed   By: Yvonne Kendall   On: 09/18/2021 13:45    Impression:  He has a stable 4.7 cm fusiform ascending aortic aneurysm which extends from the sinotubular junction up to the distal ascending aorta.  The aortic root is normal size and the aorta appears to return to normal size before the takeoff of the innominate artery.  This is unchanged and still below the surgical threshold of 5.5 cm in a patient with a trileaflet aortic valve.  I reviewed the CTA images with the patient and his wife and answered their questions.  I stressed the importance of good blood pressure control in preventing further enlargement and acute aortic dissection.  I advised him to check his blood pressure at home periodically at different times of the day with a goal of 130/80 or less.  I also advised him against performing any strenuous lifting that may require a Valsalva maneuver and could raise his blood pressure suddenly.  Plan:  I will plan to see him back in 6 months with a CTA of the chest for follow-up of his ascending aortic aneurysm.  I spent 20 minutes performing this established patient evaluation and > 50% of this time was spent face to face counseling and coordinating the care of this patient's aortic aneurysm.    Gaye Pollack, MD [facility] [phone]

## 2021-09-19 ENCOUNTER — Encounter (INDEPENDENT_AMBULATORY_CARE_PROVIDER_SITE_OTHER): Payer: Medicare Other | Admitting: Ophthalmology

## 2021-09-24 ENCOUNTER — Ambulatory Visit (INDEPENDENT_AMBULATORY_CARE_PROVIDER_SITE_OTHER): Payer: Medicare Other | Admitting: Family Medicine

## 2021-09-24 VITALS — BP 110/64 | HR 96 | Temp 97.5°F | Wt 185.8 lb

## 2021-09-24 DIAGNOSIS — H9122 Sudden idiopathic hearing loss, left ear: Secondary | ICD-10-CM

## 2021-09-24 DIAGNOSIS — I723 Aneurysm of iliac artery: Secondary | ICD-10-CM | POA: Diagnosis not present

## 2021-09-24 DIAGNOSIS — I7121 Aneurysm of the ascending aorta, without rupture: Secondary | ICD-10-CM

## 2021-09-24 DIAGNOSIS — E785 Hyperlipidemia, unspecified: Secondary | ICD-10-CM
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
## 2021-10-24 ENCOUNTER — Telehealth: Payer: Self-pay | Admitting: Family Medicine

## 2021-10-24 NOTE — Telephone Encounter (Signed)
Left message for patient to call back and schedule Medicare Annual Wellness Visit (AWV) either virtually or in office. Left  my Anthony Mendez number [phone]   Last AWV ;04/22/12 please schedule at anytime with [facility] Nurse Health Advisor 1 or 2   This should be a 45 minute visit.

## 2021-11-01 ENCOUNTER — Telehealth: Payer: Self-pay | Admitting: Family Medicine

## 2021-11-01 NOTE — Telephone Encounter (Signed)
I called patient to remind him of his telephone appt on 2/27.  He stated he was just in to see Dr [name] last month and answered a lot of questions.  He wanted to make sure he was not repeating something that was already done.    Pamala Hurry can you look to see if patient needs AWV.  What I  see it looks like his last awv was in 2013.

## 2021-11-01 NOTE — Telephone Encounter (Signed)
Spouse aware pt has not has awv before and that Pamala Hurry will call him Monday

## 2021-11-04 ENCOUNTER — Ambulatory Visit (INDEPENDENT_AMBULATORY_CARE_PROVIDER_SITE_OTHER): Payer: Medicare Other

## 2021-11-04 VITALS — Ht 71.0 in | Wt 180.0 lb

## 2021-11-04 DIAGNOSIS — Z0000 Encounter for general adult medical examination without abnormal findings: Secondary | ICD-10-CM | POA: Diagnosis not present
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
## 2021-11-04 NOTE — Patient Instructions (Signed)
[name] , Thank you for taking time to come for your Medicare Wellness Visit. I appreciate your ongoing commitment to your health goals. Please review the following plan we discussed and let me know if I can assist you in the future.   Screening recommendations/referrals: Colonoscopy: not required Recommended yearly ophthalmology/optometry visit for glaucoma screening and checkup Recommended yearly dental visit for hygiene and checkup  Vaccinations: Influenza vaccine:  Pneumococcal vaccine: completed 10/22/2015 Tdap vaccine: completed 07/25/2012, due 07/25/2022 Shingles vaccine: due   Covid-19:  10/24/2019, 10/02/2019 (need date of last booster)  Advanced directives: copy in chart  Conditions/risks identified: none  Next appointment: Follow up in one year for your annual wellness visit.   Preventive Care 81 Years and Older, Male Preventive care refers to lifestyle choices and visits with your health care provider that can promote health and wellness. What does preventive care include? A yearly physical exam. This is also called an annual well check. Dental exams once or twice a year. Routine eye exams. Ask your health care provider how often you should have your eyes checked. Personal lifestyle choices, including: Daily care of your teeth and gums. Regular physical activity. Eating a healthy diet. Avoiding tobacco and drug use. Limiting alcohol use. Practicing safe sex. Taking low doses of aspirin every day. Taking vitamin and mineral supplements as recommended by your health care provider. What happens during an annual well check? The services and screenings done by your health care provider during your annual well check will depend on your age, overall health, lifestyle risk factors, and family history of disease. Counseling  Your health care provider may ask you questions about your: Alcohol use. Tobacco use. Drug use. Emotional well-being. Home and relationship  well-being. Sexual activity. Eating habits. History of falls. Memory and ability to understand (cognition). Work and work Statistician. Screening  You may have the following tests or measurements: Height, weight, and BMI. Blood pressure. Lipid and cholesterol levels. These may be checked every 5 years, or more frequently if you are over 55 years old. Skin check. Lung cancer screening. You may have this screening every year starting at age 41 if you have a 30-pack-year history of smoking and currently smoke or have quit within the past 15 years. Fecal occult blood test (FOBT) of the stool. You may have this test every year starting at age 3. Flexible sigmoidoscopy or colonoscopy. You may have a sigmoidoscopy every 5 years or a colonoscopy every 10 years starting at age 62. Prostate cancer screening. Recommendations will vary depending on your family history and other risks. Hepatitis C blood test. Hepatitis B blood test. Sexually transmitted disease (STD) testing. Diabetes screening. This is done by checking your blood sugar (glucose) after you have not eaten for a while (fasting). You may have this done every 1-3 years. Abdominal aortic aneurysm (AAA) screening. You may need this if you are a current or former smoker. Osteoporosis. You may be screened starting at age 69 if you are at high risk. Talk with your health care provider about your test results, treatment options, and if necessary, the need for more tests. Vaccines  Your health care provider may recommend certain vaccines, such as: Influenza vaccine. This is recommended every year. Tetanus, diphtheria, and acellular pertussis (Tdap, Td) vaccine. You may need a Td booster every 10 years. Zoster vaccine. You may need this after age 86. Pneumococcal 13-valent conjugate (PCV13) vaccine. One dose is recommended after age 42. Pneumococcal polysaccharide (PPSV23) vaccine. One dose is recommended after  age 5. Talk to your health care  provider about which screenings and vaccines you need and how often you need them. This information is not intended to replace advice given to you by your health care provider. Make sure you discuss any questions you have with your health care provider. Document Released: 09/21/2015 Document Revised: 05/14/2016 Document Reviewed: 06/26/2015 Elsevier Interactive Patient Education  2017 [employer] Prevention in the Home Falls can cause injuries. They can happen to people of all ages. There are many things you can do to make your home safe and to help prevent falls. What can I do on the outside of my home? Regularly fix the edges of walkways and driveways and fix any cracks. Remove anything that might make you trip as you walk through a door, such as a raised step or threshold. Trim any bushes or trees on the path to your home. Use bright outdoor lighting. Clear any walking paths of anything that might make someone trip, such as rocks or tools. Regularly check to see if handrails are loose or broken. Make sure that both sides of any steps have handrails. Any raised decks and porches should have guardrails on the edges. Have any leaves, snow, or ice cleared regularly. Use sand or salt on walking paths during winter. Clean up any spills in your garage right away. This includes oil or grease spills. What can I do in the bathroom? Use night lights. Install grab bars by the toilet and in the tub and shower. Do not use towel bars as grab bars. Use non-skid mats or decals in the tub or shower. If you need to sit down in the shower, use a plastic, non-slip stool. Keep the floor dry. Clean up any water that spills on the floor as soon as it happens. Remove soap buildup in the tub or shower regularly. Attach bath mats securely with double-sided non-slip rug tape. Do not have throw rugs and other things on the floor that can make you trip. What can I do in the bedroom? Use night lights. Make  sure that you have a light by your bed that is easy to reach. Do not use any sheets or blankets that are too big for your bed. They should not hang down onto the floor. Have a firm chair that has side arms. You can use this for support while you get dressed. Do not have throw rugs and other things on the floor that can make you trip. What can I do in the kitchen? Clean up any spills right away. Avoid walking on wet floors. Keep items that you use a lot in easy-to-reach places. If you need to reach something above you, use a strong step stool that has a grab bar. Keep electrical cords out of the way. Do not use floor polish or wax that makes floors slippery. If you must use wax, use non-skid floor wax. Do not have throw rugs and other things on the floor that can make you trip. What can I do with my stairs? Do not leave any items on the stairs. Make sure that there are handrails on both sides of the stairs and use them. Fix handrails that are broken or loose. Make sure that handrails are as long as the stairways. Check any carpeting to make sure that it is firmly attached to the stairs. Fix any carpet that is loose or worn. Avoid having throw rugs at the top or bottom of the stairs. If you do  have throw rugs, attach them to the floor with carpet tape. Make sure that you have a light switch at the top of the stairs and the bottom of the stairs. If you do not have them, ask someone to add them for you. What else can I do to help prevent falls? Wear shoes that: Do not have high heels. Have rubber bottoms. Are comfortable and fit you well. Are closed at the toe. Do not wear sandals. If you use a stepladder: Make sure that it is fully opened. Do not climb a closed stepladder. Make sure that both sides of the stepladder are locked into place. Ask someone to hold it for you, if possible. Clearly mark and make sure that you can see: Any grab bars or handrails. First and last steps. Where the  edge of each step is. Use tools that help you move around (mobility aids) if they are needed. These include: Canes. Walkers. Scooters. Crutches. Turn on the lights when you go into a dark area. Replace any light bulbs as soon as they burn out. Set up your furniture so you have a clear path. Avoid moving your furniture around. If any of your floors are uneven, fix them. If there are any pets around you, be aware of where they are. Review your medicines with your doctor. Some medicines can make you feel dizzy. This can increase your chance of falling. Ask your doctor what other things that you can do to help prevent falls. This information is not intended to replace advice given to you by your health care provider. Make sure you discuss any questions you have with your health care provider. Document Released: 06/21/2009 Document Revised: 01/31/2016 Document Reviewed: 09/29/2014 Elsevier Interactive Patient Education  2017 [employer].

## 2021-11-06 ENCOUNTER — Encounter: Payer: Self-pay | Admitting: Family Medicine

## 2021-11-06 ENCOUNTER — Telehealth: Payer: Self-pay

## 2021-11-06 DIAGNOSIS — Z0279 Encounter for issue of other medical certificate: Secondary | ICD-10-CM

## 2021-11-06 NOTE — Telephone Encounter (Signed)
Letter completed by Dr [name]. Information faxed to [facility] as requested by patient. Faxed to [phone]. Letter is a part of EMR; copy of request sent to file. ?

## 2021-11-06 NOTE — Telephone Encounter (Signed)
Pt is aware letter has been faxed ?

## 2022-02-12 ENCOUNTER — Other Ambulatory Visit: Payer: Self-pay | Admitting: Surgery

## 2022-02-12 DIAGNOSIS — I7121 Aneurysm of the ascending aorta, without rupture: Secondary | ICD-10-CM

## 2022-03-19 DIAGNOSIS — L570 Actinic keratosis: Secondary | ICD-10-CM | POA: Diagnosis not present

## 2022-03-19 DIAGNOSIS — L821 Other seborrheic keratosis: Secondary | ICD-10-CM | POA: Diagnosis not present

## 2022-03-19 DIAGNOSIS — C44629 Squamous cell carcinoma of skin of left upper limb, including shoulder: Secondary | ICD-10-CM | POA: Diagnosis not present

## 2022-03-26 DIAGNOSIS — C44629 Squamous cell carcinoma of skin of left upper limb, including shoulder: Secondary | ICD-10-CM | POA: Diagnosis not present

## 2022-04-09 ENCOUNTER — Ambulatory Visit: Payer: Medicare Other | Admitting: Surgery

## 2022-04-09 ENCOUNTER — Other Ambulatory Visit: Payer: Medicare Other

## 2022-04-11 DIAGNOSIS — H608X3 Other otitis externa, bilateral: Secondary | ICD-10-CM | POA: Diagnosis not present

## 2022-04-11 DIAGNOSIS — H6622 Chronic atticoantral suppurative otitis media, left ear: Secondary | ICD-10-CM | POA: Diagnosis not present

## 2022-05-19 ENCOUNTER — Ambulatory Visit
Admission: RE | Admit: 2022-05-19 | Discharge: 2022-05-19 | Disposition: A | Discharge status: Home / Self Care | Payer: Medicare Other | Source: Ambulatory Visit | Attending: Surgery | Admitting: Surgery

## 2022-05-19 DIAGNOSIS — M4854XA Collapsed vertebra, not elsewhere classified, thoracic region, initial encounter for fracture: Secondary | ICD-10-CM | POA: Diagnosis not present

## 2022-05-19 DIAGNOSIS — I712 Thoracic aortic aneurysm, without rupture, unspecified: Secondary | ICD-10-CM | POA: Diagnosis not present

## 2022-05-19 DIAGNOSIS — I700 Atherosclerosis of aorta: Secondary | ICD-10-CM | POA: Diagnosis not present

## 2022-05-19 DIAGNOSIS — I7121 Aneurysm of the ascending aorta, without rupture: Secondary | ICD-10-CM

## 2022-05-19 MED ORDER — IOPAMIDOL (ISOVUE-370) INJECTION 76%
75.0000 mL | Freq: Once | INTRAVENOUS | Status: AC | PRN
  Administered 2022-05-19: 75 mL via INTRAVENOUS

## 2022-05-21 ENCOUNTER — Ambulatory Visit: Payer: Medicare Other | Admitting: Surgery

## 2022-05-21 ENCOUNTER — Encounter: Payer: Self-pay | Admitting: Surgery

## 2022-05-21 VITALS — BP 134/78 | HR 85 | Resp 20 | Ht 71.0 in | Wt 180.0 lb

## 2022-05-21 DIAGNOSIS — I7121 Aneurysm of the ascending aorta, without rupture: Secondary | ICD-10-CM | POA: Diagnosis not present

## 2022-05-26 NOTE — Progress Notes (Signed)
HPI:  The patient is an 81 year old gentleman who I have been following with a stable 4.7 cm fusiform ascending aortic aneurysm with a trileaflet aortic valve with visually mild aortic insufficiency that was read as moderate by AI pressure half-time. He was also found to have a right common iliac artery aneurysm which was successfully treated with endovascular repair by Dr. Trula Slade in February 2022.  Since I last saw him in January 2023 he has continued to feel well without chest pain or shortness of breath.  He remains active and still works.  Current Outpatient Medications  Medication Sig Dispense Refill   aspirin EC 81 MG tablet Take 81 mg by mouth daily. Swallow whole.     atorvastatin (LIPITOR) 20 MG tablet Take 1 tablet (20 mg total) by mouth daily. 30 tablet 6   Cholecalciferol (VITAMIN D3 PO) Take 1 tablet by mouth daily.     losartan (COZAAR) 25 MG tablet Take 1 tablet (25 mg total) by mouth daily. 90 tablet 3   Polyethyl Glycol-Propyl Glycol (LUBRICANT EYE DROPS) 0.4-0.3 % SOLN Place 1 drop into both eyes 3 (three) times daily as needed (dry/irritated eyes.).     No current facility-administered medications for this visit.     Physical Exam: BP 134/78 (BP Location: Left Arm, Patient Position: Sitting)   Pulse 85   Resp 20   Ht '5\' 11"'$  (1.803 m)   Wt 180 lb (81.6 kg)   SpO2 95% Comment: RA  BMI 25.10 kg/m  He looks well. Cardiac exam shows a regular rate and rhythm with normal heart sounds.  There is no murmur. Lungs are clear. There is no peripheral edema.  Diagnostic Tests:  Narrative & Impression  CLINICAL DATA:  Aortic aneurysm, known or suspected   EXAM: CT ANGIOGRAPHY CHEST WITH CONTRAST   TECHNIQUE: Multidetector CT imaging of the chest was performed using the standard protocol during bolus administration of intravenous contrast. Multiplanar CT image reconstructions and MIPs were obtained to evaluate the vascular anatomy.   RADIATION DOSE REDUCTION:  This exam was performed according to the departmental dose-optimization program which includes automated exposure control, adjustment of the mA and/or kV according to patient size and/or use of iterative reconstruction technique.   CONTRAST:  51m ISOVUE-370 IOPAMIDOL (ISOVUE-370) INJECTION 76%   COMPARISON:  09/18/2021   FINDINGS: Cardiovascular: Ascending thoracic aorta dilatation is again identified. This measures stable at 4.7 cm. Mild mixed plaque along the thoracic aorta. Heart size is unchanged. No pericardial effusion.   Mediastinum/Nodes: No enlarged nodes. Thyroid and esophagus are unremarkable.   Lungs/Pleura: Mild subpleural reticulation is again noted. No pleural effusion.   Upper Abdomen: No acute abnormality.   Musculoskeletal: Chronic thoracic spine compression fractures.   Review of the MIP images confirms the above findings.   IMPRESSION: Stable 4.7 cm ascending thoracic aorta aneurysm. Semi-annual imaging follow-up remains the recommendation.     Electronically Signed   By: PMacy MisM.D.   On: 05/19/2022 13:50      Impression:  He has a stable 4.7 cm fusiform ascending aortic aneurysm.  This has been stable dating back to November 2021 when it was discovered.  It is well below the surgical threshold of 5.5 cm.  I reviewed the CTA images with him and his wife and answered all their questions.    I stressed the importance of good blood pressure control in preventing further enlargement and acute aortic dissection.  I also advised him against performing any strenuous lifting that  may require a Valsalva maneuver and could raise his blood pressure suddenly.  Plan:  He will return to see me in 1 year with a CTA of the chest.  I spent 20 minutes performing this established patient evaluation and > 50% of this time was spent face to face counseling and coordinating the care of this patient's aortic aneurysm.    Anthony Pollack, MD [facility] [phone]

## 2022-06-12 DIAGNOSIS — H7012 Chronic mastoiditis, left ear: Secondary | ICD-10-CM | POA: Diagnosis not present

## 2022-08-14 DIAGNOSIS — H6612 Chronic tubotympanic suppurative otitis media, left ear: Secondary | ICD-10-CM | POA: Diagnosis not present

## 2022-08-14 DIAGNOSIS — H608X3 Other otitis externa, bilateral: Secondary | ICD-10-CM | POA: Diagnosis not present

## 2022-08-14 DIAGNOSIS — H722X2 Other marginal perforations of tympanic membrane, left ear: Secondary | ICD-10-CM | POA: Diagnosis not present

## 2022-08-28 IMAGING — CT CT ANGIO CHEST
2 of 9 series · 13 of 36 positions shown · IV contrast (OMNIPAQUE 350)
Comparison: None.

CLINICAL DATA: Thoracic aortic aneurysm and moderate aortic valve
regurgitation on echocardiography.

EXAM:
CT ANGIOGRAPHY CHEST, ABDOMEN AND PELVIS
TECHNIQUE: Multidetector CT imaging through the chest, abdomen and pelvis was
performed using the CT gated protocol during bolus administration of
intravenous contrast. Multiplanar reconstructed images and MIPs were
obtained and reviewed to evaluate the vascular anatomy.
CONTRAST:  100mL OMNIPAQUE IOHEXOL 350 MG/ML SOLN

[Series 514: angio thins · axial · 0.67mm/px · z∈[-642,-56]mm · 12 of 10076 slices shown]
[im 439/10076  lung]
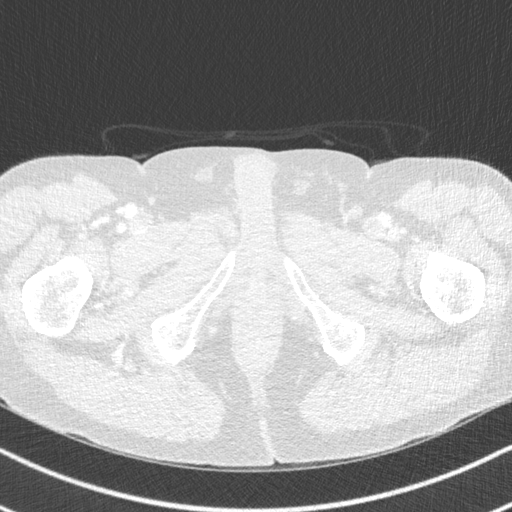
[im 1315/10076  mediastinal]
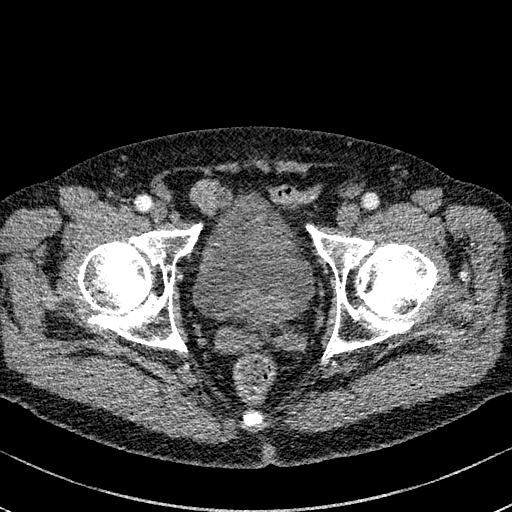
[im 2191/10076  lung]
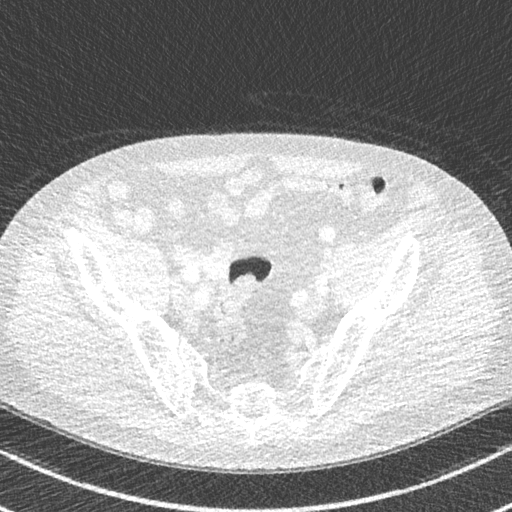
[im 3067/10076  mediastinal]
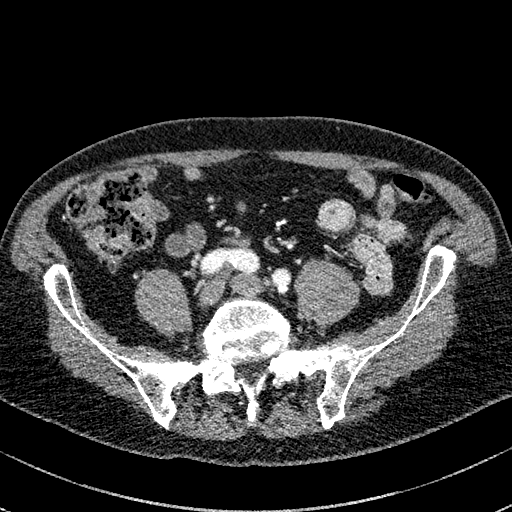
[im 3943/10076  lung]
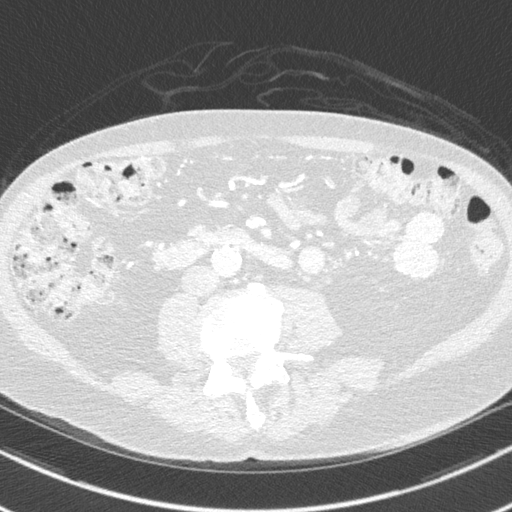
[im 4819/10076  mediastinal]
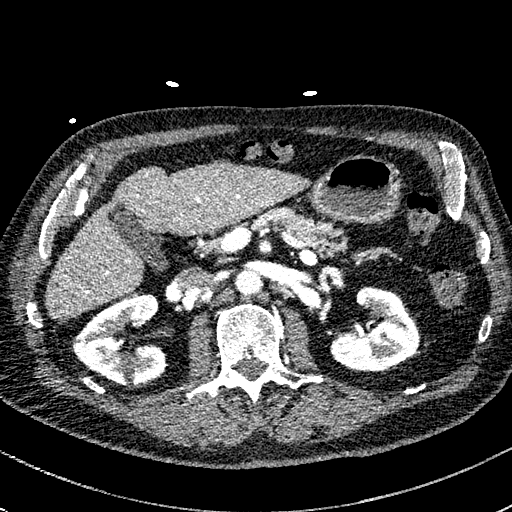
[im 5257/10076  lung]
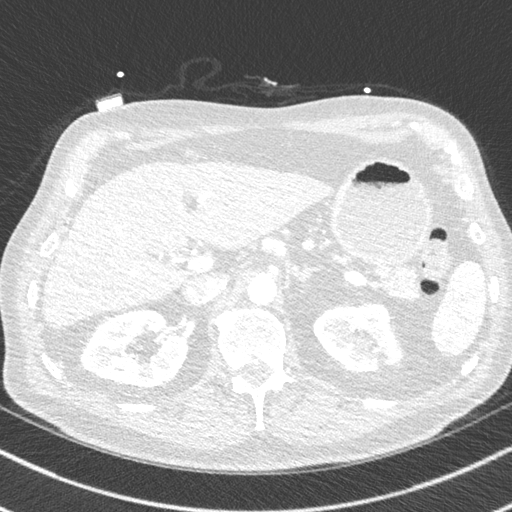
[im 6133/10076  mediastinal]
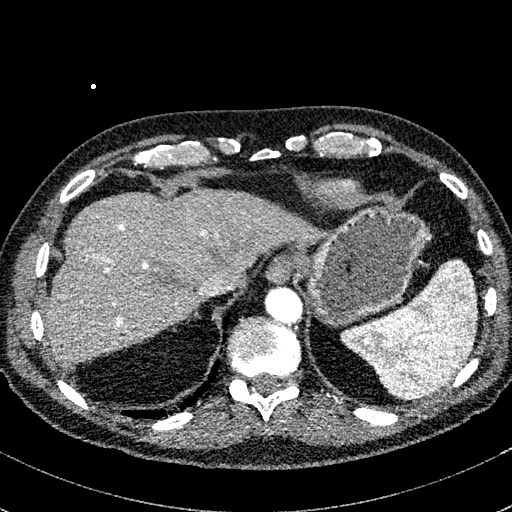
[im 7009/10076  lung]
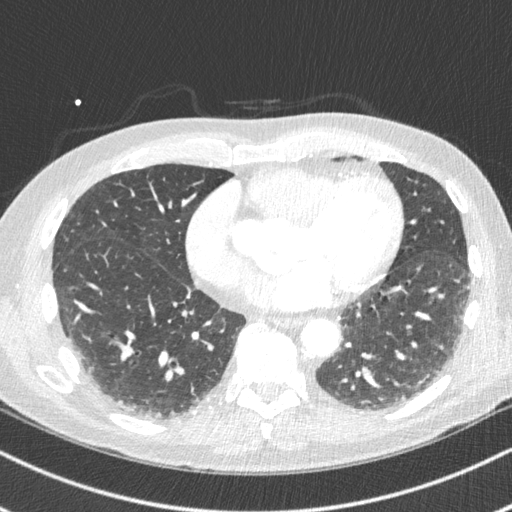
[im 7885/10076  mediastinal]
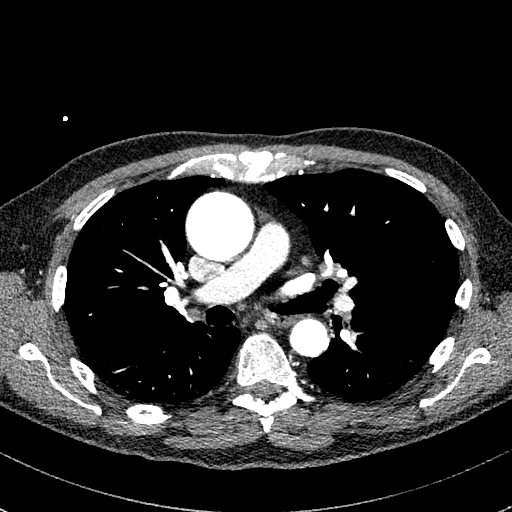
[im 8761/10076  lung]
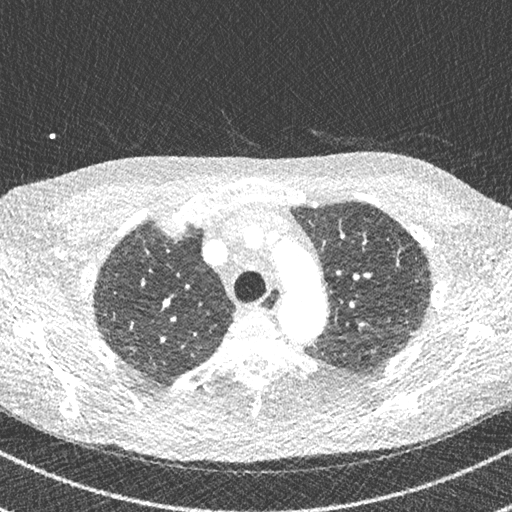
[im 9637/10076  mediastinal]
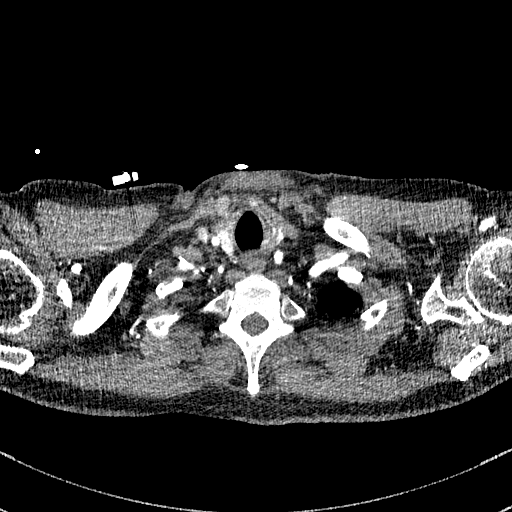

[Series 516: coronal 74 % · coronal · 0.84mm/px · 1 of 119 slices shown]
[im 60/119  mediastinal]
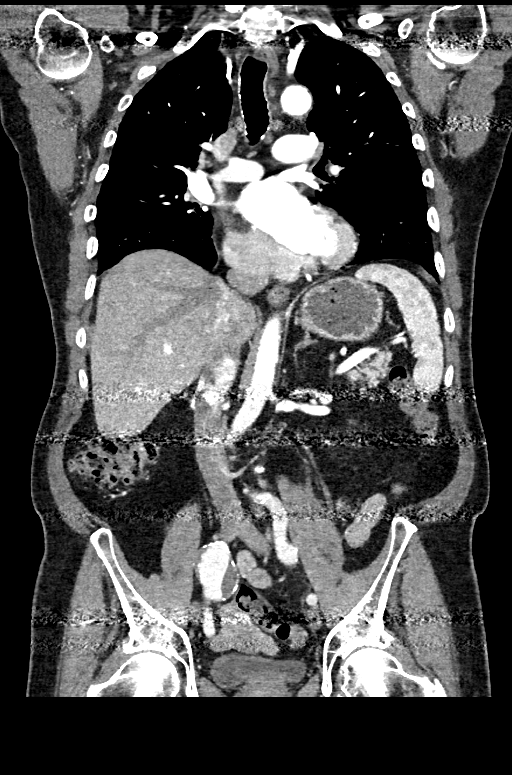

[13 of 36 positions shown; findings below may reference images not displayed]

FINDINGS: CTA CHEST FINDINGS

Cardiovascular: Aortic root at the sinuses measures roughly 3.7 cm.
Despite the ECG gating, there is pulsation artifact at the aortic
root. Difficult to accurately measure the sinotubular junction.
Fusiform aneurysm of the ascending thoracic aorta measuring up to
4.7 cm. No evidence for an aortic dissection. Proximal aortic arch
measures 2.8 cm. Typical three-vessel arch anatomy. The great
vessels are patent. Proximal vertebral arteries are patent
bilaterally. Scattered areas of artifact related to the ECG gating
technique. Distal aortic arch measures roughly 2.3 cm. Proximal
descending thoracic aorta is prominent measuring up to 3.1 cm.
Atherosclerotic calcifications involving the descending thoracic
aorta. Mid descending thoracic aorta measures 2.5 cm. Distal
descending thoracic aorta measures 2.1 cm. Limited evaluation of the
coronary arteries. Heart size within normal limits. No significant
pericardial fluid. Main pulmonary arteries are patent but limited
evaluation of the pulmonary arteries beyond the main pulmonary
arteries.

Mediastinum/Nodes: Thyroid tissue is unremarkable. Small lymph nodes
throughout the mediastinum or hilum but no significant lymph node
enlargement. Mild dilatation of the esophagus.

Lungs/Pleura: Trachea and mainstem bronchi are patent. Focal
thickening along the right major fissure on sequence 6, image 53. 2
mm nodule in the right lower lobe on sequence 6, image 65. 5 mm
peripheral nodule in the right lower lobe on sequence 6 image 65. No
large pleural effusions. No significant airspace disease or lung
consolidation. Slightly prominent subpleural densities in both lungs
that could represent chronic changes and mild scarring. No evidence
for honeycombing or cystic formations.

Musculoskeletal: No acute bone abnormalities.

Review of the MIP images confirms the above findings.

CTA ABDOMEN AND PELVIS FINDINGS

VASCULAR

Aorta: Supraceliac abdominal aorta measures 2.0 cm. Atherosclerotic
calcifications in the abdominal aorta without aneurysm and
dissection. Abdominal aorta is mildly tortuous. Infrarenal abdominal
aorta measures up to 1.9 cm.

Celiac: Patent without significant aneurysm or stenosis.

SMA: Limited evaluation of the proximal SMA due to the artifact from
the ECG gating. SMA appears to be widely patent without significant
atherosclerotic plaque.

Renals: Limited evaluation of the renal artery origins due to the
artifact and poor opacification of the renal arteries. Bilateral
renal arteries appear to be patent but very limited evaluation.
Contrast was preferentially in the renal veins at the level of the
kidneys.

IMA: Patent.

Inflow: Proximal right common iliac artery measures 1.5 cm. Aneurysm
involving the distal right common iliac artery measuring up to
cm on sequence 5, image 158. Aneurysm has peripheral calcifications
and a large amount of mural thrombus. Aneurysm involves the right
iliac artery bifurcation. Proximal right internal iliac artery is
patent but limited evaluation of the right internal iliac arteries
due to artifact. Right external iliac artery is patent. Proximal
left common iliac artery measures 1.4 cm. Mid left common iliac
artery measures up to 2.0 cm. Distal left common iliac artery
measures up to 2.0 cm. Eccentric periphery calcified aneurysm sac
involving the distal left common iliac artery at the bifurcation
which mostly contains thrombus. Limited evaluation of the left
internal iliac artery. Left external iliac artery is patent.

Proximal outflow: Proximal femoral arteries are patent bilaterally.

Veins: Bilateral renal veins are patent. Portal venous system is
patent.

Review of the MIP images confirms the above findings.

NON-VASCULAR

Hepatobiliary: No focal liver lesion. Normal appearance of the
gallbladder. No significant biliary dilatation.

Pancreas: Unremarkable. No pancreatic ductal dilatation or
surrounding inflammatory changes.

Spleen: Normal in size without focal abnormality.

Adrenals/Urinary Tract: Normal appearance of the adrenal glands.
cm exophytic low-density structure involving the right kidney upper
pole is slightly dense with Hounsfield units measuring roughly 45.
Low-density exophytic cyst involving the right kidney upper pole.
Multiple small cysts involving the left kidney upper pole. Slightly
dense low-density structure involving the anterior left kidney on
sequence 5 image 118 that measures roughly 9 mm. Concern for an
exophytic lesion in the left kidney lower pole on sequence 5 image
123 but unfortunately there is artifact in this area and this area
cannot be evaluated. Normal appearance of the urinary bladder.

Stomach/Bowel: Stomach is within normal limits. Appendix appears
normal. No evidence of bowel wall thickening, distention, or
inflammatory changes.

Lymphatic: No lymph node enlargement in the abdomen or pelvis.

Reproductive: Enlarged prostate measuring 5.9 cm in diameter.

Other: Small right inguinal hernia containing fat. Negative for
ascites. Negative for free air.

Musculoskeletal: No acute bone abnormality.

Review of the MIP images confirms the above findings.
IMPRESSION: 1. Fusiform aneurysm of the ascending thoracic aorta measuring up to
4.7 cm. Limited evaluation of the aortic root despite using ECG
gating. No evidence for an aortic dissection. Ectasia of the
proximal descending thoracic aorta measuring up to 3.1 cm. Ascending
thoracic aortic aneurysm. Recommend semi-annual imaging followup by
CTA or MRA and referral to cardiothoracic surgery if not already
obtained. This recommendation follows 4393
ACCF/AHA/AATS/ACR/ASA/SCA/ROBERTO/NICKELSEN/CHE WAH/NUGMAN Guidelines for the
Diagnosis and Management of Patients With Thoracic Aortic Disease.
Circulation. 4393; 121: E266-e369. Aortic aneurysm NOS (EV3QL-O07.5)
2. Aneurysm of the distal right common iliac artery measuring up to
3.6 cm.
3. Aneurysm of the left common iliac artery measuring up to 2.0 cm
with a small eccentric component at the left iliac artery
bifurcation as described.
4.  Aortic Atherosclerosis (EV3QL-2W6.6).
5. Technically limited examination due to multiple areas of artifact
related to the technique.
6. **An incidental finding of potential clinical significance has
been found. Bilateral renal cysts. At least 2 of these presumed
cystic structures are hyperdense and indeterminate. In addition,
there may be an indeterminate exophytic structure involving the left
kidney lower pole which is poorly characterized due to the artifact.
Recommend additional imaging of the kidneys. If the patient can not
tolerate MRI, recommend further characterization with a renal MRI,
with and without contrast. Otherwise, recommend a dedicated
abdominal CT using renal protocol, with and without contrast.**
7. Scattered subpleural densities in lungs which could represent
chronic changes. Two small pulmonary nodules, largest measuring 5
mm. No follow-up needed if patient is low-risk (and has no known or
suspected primary neoplasm). Non-contrast chest CT can be considered
in 12 months if patient is high-risk. This recommendation follows
the consensus statement: Guidelines for Management of Incidental
Pulmonary Nodules Detected on CT Images: From the [HOSPITAL]
8. Enlarged prostate.

## 2022-08-28 IMAGING — CT CT CTA ABD/PEL W/CM AND/OR W/O CM
2 of 4 series · 11 of 46 positions shown, 12 images · IV contrast (OMNIPAQUE 350)
Comparison: None.

CLINICAL DATA: Thoracic aortic aneurysm and moderate aortic valve
regurgitation on echocardiography.

EXAM:
CT ANGIOGRAPHY CHEST, ABDOMEN AND PELVIS
TECHNIQUE: Multidetector CT imaging through the chest, abdomen and pelvis was
performed using the CT gated protocol during bolus administration of
intravenous contrast. Multiplanar reconstructed images and MIPs were
obtained and reviewed to evaluate the vascular anatomy.
CONTRAST:  100mL OMNIPAQUE IOHEXOL 350 MG/ML SOLN

[Series 5: angio st 74 % · axial · 0.67mm/px · z∈[-602,-94]mm · 8 of 213 slices shown, 9 images]
[im 22/213  soft-tissue]
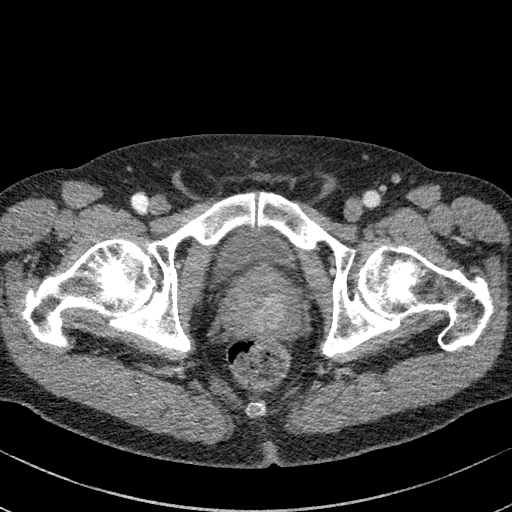
[im 22/213  bone]
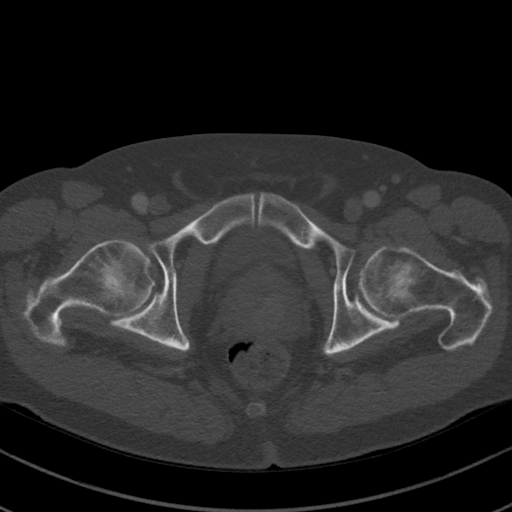
[im 43/213  soft-tissue]
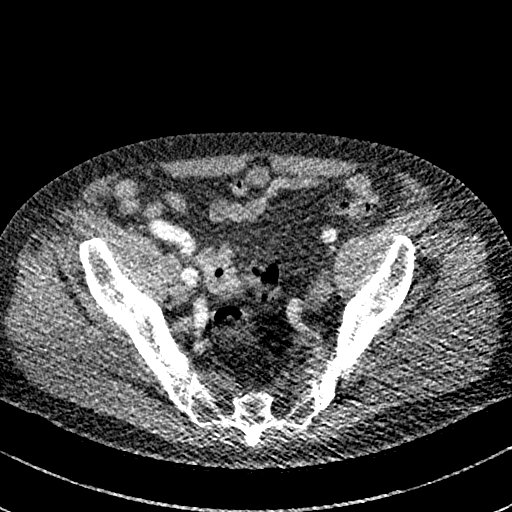
[im 64/213  soft-tissue]
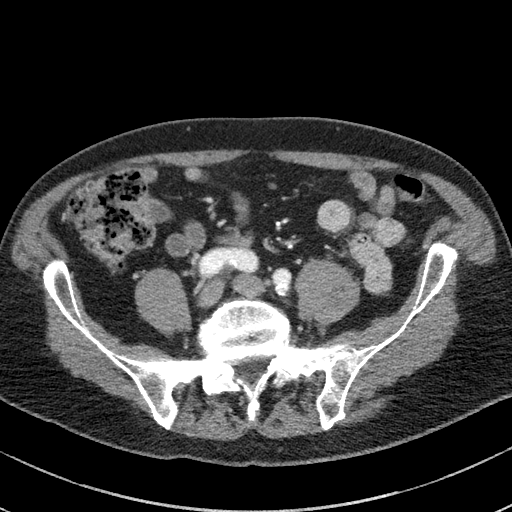
[im 96/213  soft-tissue]
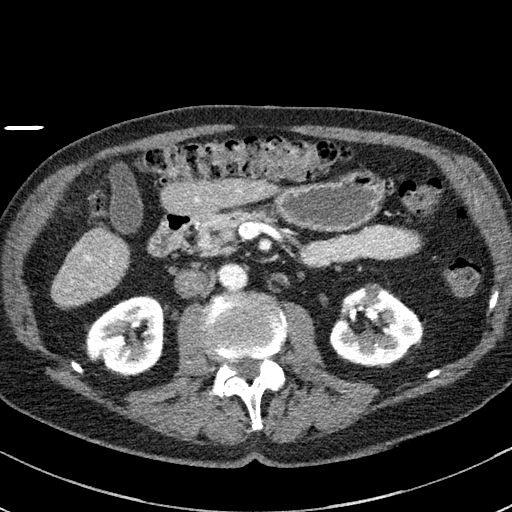
[im 117/213  soft-tissue]
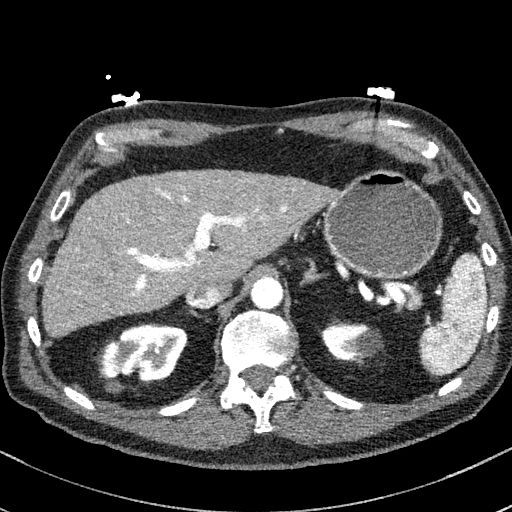
[im 149/213  soft-tissue]
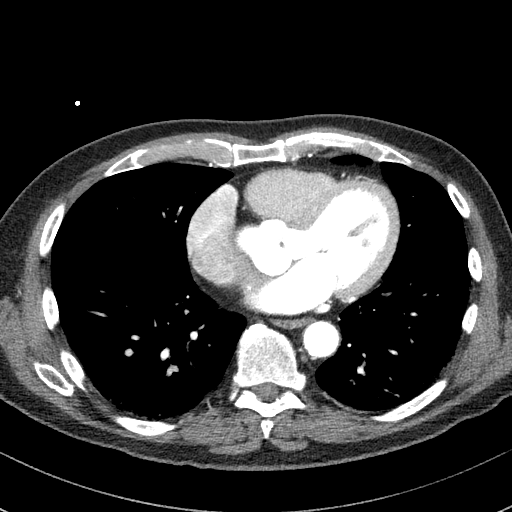
[im 170/213  soft-tissue]
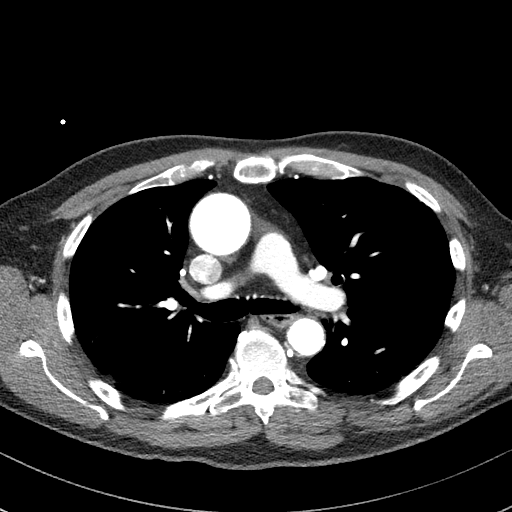
[im 191/213  soft-tissue]
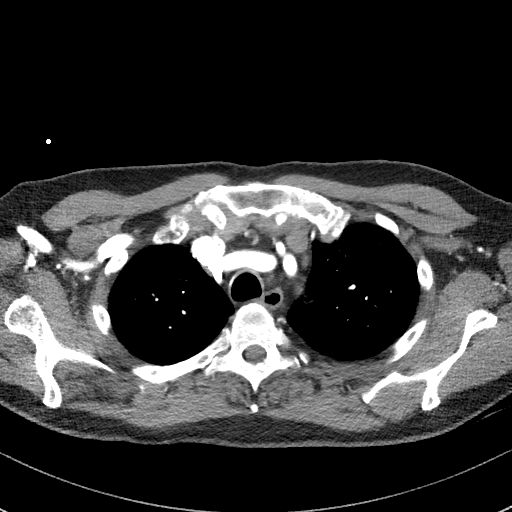

[Series 8: coronal 74 % · coronal · 0.84mm/px · 3 of 119 slices shown]
[im 40/119  soft-tissue]
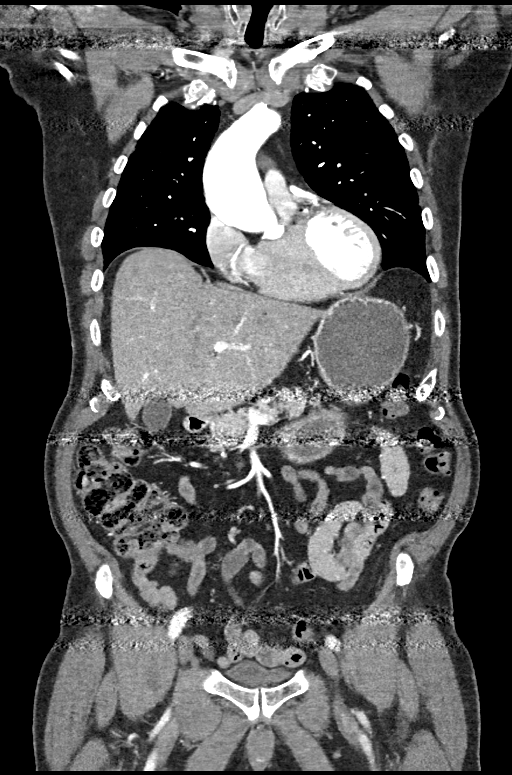
[im 53/119  soft-tissue]
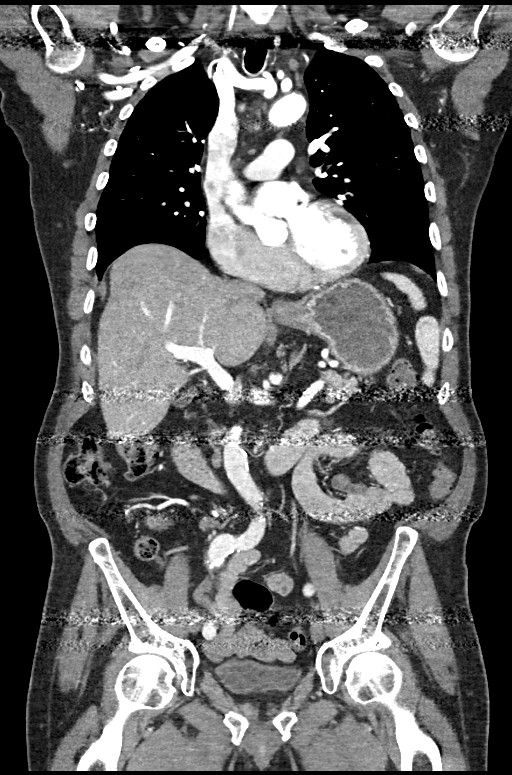
[im 66/119  soft-tissue]
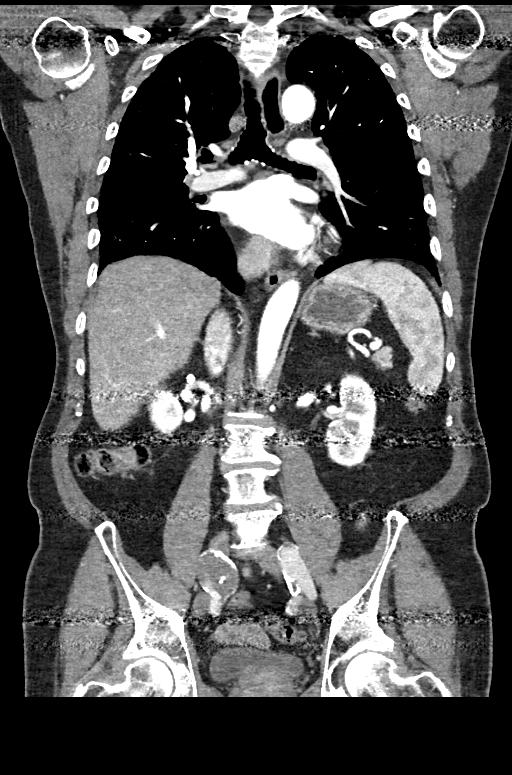

[11 of 46 positions shown; findings below may reference images not displayed]

FINDINGS: CTA CHEST FINDINGS

Cardiovascular: Aortic root at the sinuses measures roughly 3.7 cm.
Despite the ECG gating, there is pulsation artifact at the aortic
root. Difficult to accurately measure the sinotubular junction.
Fusiform aneurysm of the ascending thoracic aorta measuring up to
4.7 cm. No evidence for an aortic dissection. Proximal aortic arch
measures 2.8 cm. Typical three-vessel arch anatomy. The great
vessels are patent. Proximal vertebral arteries are patent
bilaterally. Scattered areas of artifact related to the ECG gating
technique. Distal aortic arch measures roughly 2.3 cm. Proximal
descending thoracic aorta is prominent measuring up to 3.1 cm.
Atherosclerotic calcifications involving the descending thoracic
aorta. Mid descending thoracic aorta measures 2.5 cm. Distal
descending thoracic aorta measures 2.1 cm. Limited evaluation of the
coronary arteries. Heart size within normal limits. No significant
pericardial fluid. Main pulmonary arteries are patent but limited
evaluation of the pulmonary arteries beyond the main pulmonary
arteries.

Mediastinum/Nodes: Thyroid tissue is unremarkable. Small lymph nodes
throughout the mediastinum or hilum but no significant lymph node
enlargement. Mild dilatation of the esophagus.

Lungs/Pleura: Trachea and mainstem bronchi are patent. Focal
thickening along the right major fissure on sequence 6, image 53. 2
mm nodule in the right lower lobe on sequence 6, image 65. 5 mm
peripheral nodule in the right lower lobe on sequence 6 image 65. No
large pleural effusions. No significant airspace disease or lung
consolidation. Slightly prominent subpleural densities in both lungs
that could represent chronic changes and mild scarring. No evidence
for honeycombing or cystic formations.

Musculoskeletal: No acute bone abnormalities.

Review of the MIP images confirms the above findings.

CTA ABDOMEN AND PELVIS FINDINGS

VASCULAR

Aorta: Supraceliac abdominal aorta measures 2.0 cm. Atherosclerotic
calcifications in the abdominal aorta without aneurysm and
dissection. Abdominal aorta is mildly tortuous. Infrarenal abdominal
aorta measures up to 1.9 cm.

Celiac: Patent without significant aneurysm or stenosis.

SMA: Limited evaluation of the proximal SMA due to the artifact from
the ECG gating. SMA appears to be widely patent without significant
atherosclerotic plaque.

Renals: Limited evaluation of the renal artery origins due to the
artifact and poor opacification of the renal arteries. Bilateral
renal arteries appear to be patent but very limited evaluation.
Contrast was preferentially in the renal veins at the level of the
kidneys.

IMA: Patent.

Inflow: Proximal right common iliac artery measures 1.5 cm. Aneurysm
involving the distal right common iliac artery measuring up to
cm on sequence 5, image 158. Aneurysm has peripheral calcifications
and a large amount of mural thrombus. Aneurysm involves the right
iliac artery bifurcation. Proximal right internal iliac artery is
patent but limited evaluation of the right internal iliac arteries
due to artifact. Right external iliac artery is patent. Proximal
left common iliac artery measures 1.4 cm. Mid left common iliac
artery measures up to 2.0 cm. Distal left common iliac artery
measures up to 2.0 cm. Eccentric periphery calcified aneurysm sac
involving the distal left common iliac artery at the bifurcation
which mostly contains thrombus. Limited evaluation of the left
internal iliac artery. Left external iliac artery is patent.

Proximal outflow: Proximal femoral arteries are patent bilaterally.

Veins: Bilateral renal veins are patent. Portal venous system is
patent.

Review of the MIP images confirms the above findings.

NON-VASCULAR

Hepatobiliary: No focal liver lesion. Normal appearance of the
gallbladder. No significant biliary dilatation.

Pancreas: Unremarkable. No pancreatic ductal dilatation or
surrounding inflammatory changes.

Spleen: Normal in size without focal abnormality.

Adrenals/Urinary Tract: Normal appearance of the adrenal glands.
cm exophytic low-density structure involving the right kidney upper
pole is slightly dense with Hounsfield units measuring roughly 45.
Low-density exophytic cyst involving the right kidney upper pole.
Multiple small cysts involving the left kidney upper pole. Slightly
dense low-density structure involving the anterior left kidney on
sequence 5 image 118 that measures roughly 9 mm. Concern for an
exophytic lesion in the left kidney lower pole on sequence 5 image
123 but unfortunately there is artifact in this area and this area
cannot be evaluated. Normal appearance of the urinary bladder.

Stomach/Bowel: Stomach is within normal limits. Appendix appears
normal. No evidence of bowel wall thickening, distention, or
inflammatory changes.

Lymphatic: No lymph node enlargement in the abdomen or pelvis.

Reproductive: Enlarged prostate measuring 5.9 cm in diameter.

Other: Small right inguinal hernia containing fat. Negative for
ascites. Negative for free air.

Musculoskeletal: No acute bone abnormality.

Review of the MIP images confirms the above findings.
IMPRESSION: 1. Fusiform aneurysm of the ascending thoracic aorta measuring up to
4.7 cm. Limited evaluation of the aortic root despite using ECG
gating. No evidence for an aortic dissection. Ectasia of the
proximal descending thoracic aorta measuring up to 3.1 cm. Ascending
thoracic aortic aneurysm. Recommend semi-annual imaging followup by
CTA or MRA and referral to cardiothoracic surgery if not already
obtained. This recommendation follows 4393
ACCF/AHA/AATS/ACR/ASA/SCA/ROBERTO/NICKELSEN/CHE WAH/NUGMAN Guidelines for the
Diagnosis and Management of Patients With Thoracic Aortic Disease.
Circulation. 4393; 121: [id number]. Aortic aneurysm NOS (EV3QL-O07.5)
2. Aneurysm of the distal right common iliac artery measuring up to
3.6 cm.
3. Aneurysm of the left common iliac artery measuring up to 2.0 cm
with a small eccentric component at the left iliac artery
bifurcation as described.
4.  Aortic Atherosclerosis (EV3QL-2W6.6).
5. Technically limited examination due to multiple areas of artifact
related to the technique.
6. **An incidental finding of potential clinical significance has
been found. Bilateral renal cysts. At least 2 of these presumed
cystic structures are hyperdense and indeterminate. In addition,
there may be an indeterminate exophytic structure involving the left
kidney lower pole which is poorly characterized due to the artifact.
Recommend additional imaging of the kidneys. If the patient can not
tolerate MRI, recommend further characterization with a renal MRI,
with and without contrast. Otherwise, recommend a dedicated
abdominal CT using renal protocol, with and without contrast.**
7. Scattered subpleural densities in lungs which could represent
chronic changes. Two small pulmonary nodules, largest measuring 5
mm. No follow-up needed if patient is low-risk (and has no known or
suspected primary neoplasm). Non-contrast chest CT can be considered
in 12 months if patient is high-risk. This recommendation follows
the consensus statement: Guidelines for Management of Incidental
Pulmonary Nodules Detected on CT Images: From the [HOSPITAL]
8. Enlarged prostate.

## 2022-09-17 ENCOUNTER — Encounter (INDEPENDENT_AMBULATORY_CARE_PROVIDER_SITE_OTHER): Payer: Medicare Other | Admitting: Ophthalmology

## 2022-09-29 DIAGNOSIS — H524 Presbyopia: Secondary | ICD-10-CM | POA: Diagnosis not present

## 2022-09-29 DIAGNOSIS — Z961 Presence of intraocular lens: Secondary | ICD-10-CM | POA: Diagnosis not present

## 2022-09-30 DIAGNOSIS — L821 Other seborrheic keratosis: Secondary | ICD-10-CM | POA: Diagnosis not present

## 2022-09-30 DIAGNOSIS — Z85828 Personal history of other malignant neoplasm of skin: Secondary | ICD-10-CM | POA: Diagnosis not present

## 2022-09-30 DIAGNOSIS — L570 Actinic keratosis: Secondary | ICD-10-CM | POA: Diagnosis not present

## 2022-10-08 ENCOUNTER — Encounter (INDEPENDENT_AMBULATORY_CARE_PROVIDER_SITE_OTHER): Payer: Medicare Other | Admitting: Ophthalmology

## 2022-11-11 ENCOUNTER — Telehealth: Payer: Self-pay | Admitting: Family Medicine

## 2022-11-11 NOTE — Telephone Encounter (Signed)
Contacted [name] to schedule their annual wellness visit. Appointment made for 11/13/22.  Barkley Boards AWV direct phone # [phone]

## 2022-11-11 NOTE — Telephone Encounter (Signed)
Called patient to schedule Medicare Annual Wellness Visit (AWV). Left message for patient to call back and schedule Medicare Annual Wellness Visit (AWV).  Last date of AWV: 11/04/21  Please schedule an appointment at any time with [facility]  or [facility].  If any questions, please contact me at [phone].  Thank you ,  Barkley Boards AWV direct phone # [phone]

## 2022-11-13 ENCOUNTER — Telehealth (INDEPENDENT_AMBULATORY_CARE_PROVIDER_SITE_OTHER): Payer: Medicare Other | Admitting: Family Medicine

## 2022-11-13 DIAGNOSIS — Z0000 Encounter for general adult medical examination without abnormal findings: Secondary | ICD-10-CM | POA: Diagnosis not present

## 2022-11-13 NOTE — Patient Instructions (Signed)
I really enjoyed getting to talk with you today! I am available on Tuesdays and Thursdays for virtual visits if you have any questions or concerns, or if I can be of any further assistance.   CHECKLIST FROM ANNUAL WELLNESS VISIT:  -Follow up (please call to schedule if not scheduled after visit):  -yearly for annual wellness visit with primary care office  Here is a list of your preventive care/health maintenance measures and the plan for each if any are due:  Health Maintenance  Topic Date Due   Zoster Vaccines- Shingrix (1 of 2) Never done   INFLUENZA VACCINE  Never done   COVID-19 Vaccine (3 - 2023-24 season) 05/09/2022   DTaP/Tdap/Td (3 - Tdap) 07/25/2022   Medicare Annual Wellness (AWV)  11/13/2023   Pneumonia Vaccine 28+ Years old  Completed   HPV VACCINES  Aged Out    -See a dentist at least yearly  -Get your eyes checked and then per your eye specialist's recommendations  -Other issues addressed today:   -I have included below further information regarding a healthy whole foods based diet, physical activity guidelines for adults, stress management and opportunities for social connections. I hope you find this information useful.   -----------------------------------------------------------------------------------------------------------------------------------------------------------------------------------------------------------------------------------------------------------  NUTRITION: -eat real food: lots of colorful vegetables (half the plate) and fruits -5-7 servings of vegetables and fruits per day (fresh or steamed is best), exp. 2 servings of vegetables with lunch and dinner and 2 servings of fruit per day. Berries and greens such as kale and collards are great choices.  -consume on a regular basis: whole grains (make sure first ingredient on label contains the word "whole"), fresh fruits, fish, nuts, seeds, healthy oils (such as olive oil, avocado oil, grape seed  oil) -may eat small amounts of dairy and lean meat on occasion, but avoid processed meats such as ham, bacon, lunch meat, etc. -drink water -try to avoid fast food and pre-packaged foods, processed meat -most experts advise limiting sodium to < '2300mg'$  per day, should limit further is any chronic conditions such as high blood pressure, heart disease, diabetes, etc. The American Heart Association advised that < '1500mg'$  is is ideal -try to avoid foods that contain any ingredients with names you do not recognize  -try to avoid sugar/sweets (except for the natural sugar that occurs in fresh fruit) -try to avoid sweet drinks -try to avoid white rice, white bread, pasta (unless whole grain), white or yellow potatoes  EXERCISE GUIDELINES FOR ADULTS: -if you wish to increase your physical activity, do so gradually and with the approval of your doctor -STOP and seek medical care immediately if you have any chest pain, chest discomfort or trouble breathing when starting or increasing exercise  -move and stretch your body, legs, feet and arms when sitting for long periods -Physical activity guidelines for optimal health in adults: -least 150 minutes per week of aerobic exercise (can talk, but not sing) once approved by your doctor, 20-30 minutes of sustained activity or two 10 minute episodes of sustained activity every day.  -resistance training at least 2 days per week if approved by your doctor -balance exercises 3+ days per week:   Stand somewhere where you have something sturdy to hold onto if you lose balance.    1) lift up on toes, start with 5x per day and work up to 20x   2) stand and lift on leg straight out to the side so that foot is a few inches of the floor, start with  5x each side and work up to 20x each side   3) stand on one foot, start with 5 seconds each side and work up to 20 seconds on each side  If you need ideas or help with getting more active:  -Silver  sneakers https://tools.silversneakers.com  -Walk with a Doc: http://stephens-thompson.biz/  -try to include resistance (weight lifting/strength building) and balance exercises twice per week: or the following link for ideas: ChessContest.fr  UpdateClothing.com.cy  STRESS MANAGEMENT: -can try meditating, or just sitting quietly with deep breathing while intentionally relaxing all parts of your body for 5 minutes daily -if you need further help with stress, anxiety or depression please follow up with your primary doctor or contact the wonderful folks at [employer]: [place]: -options in [place] if you wish to engage in more social and exercise related activities:  -Silver sneakers https://tools.silversneakers.com  -Walk with a Doc: http://stephens-thompson.biz/  -Check out the [place] 50+ section on the [place] of [employer] (hiking clubs, book clubs, cards and games, chess, exercise classes, aquatic classes and much more) - see the website for details: https://www.[place].gov/departments/parks-recreation/active-adults50  -YouTube has lots of exercise videos for different ages and abilities as well  -[place] (a variety of indoor and outdoor inperson activities for adults). 4065686442. [address]  -Virtual Online Classes (a variety of topics): see seniorplanet.org or call ([phone]) [phone]  -consider volunteering at a school, hospice center, church, senior center or elsewhere
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
## 2023-04-18 ENCOUNTER — Encounter (HOSPITAL_COMMUNITY): Payer: Self-pay

## 2023-04-18 ENCOUNTER — Emergency Department (HOSPITAL_COMMUNITY)
Admission: EM | Admit: 2023-04-18 | Discharge: 2023-04-18 | Disposition: A | Discharge status: Home / Self Care | Payer: Medicare Other | Attending: Emergency Medicine | Admitting: Emergency Medicine

## 2023-04-18 ENCOUNTER — Emergency Department (HOSPITAL_COMMUNITY): Payer: Medicare Other

## 2023-04-18 ENCOUNTER — Other Ambulatory Visit: Payer: Self-pay

## 2023-04-18 DIAGNOSIS — S0993XA Unspecified injury of face, initial encounter: Secondary | ICD-10-CM | POA: Diagnosis present

## 2023-04-18 DIAGNOSIS — S51012A Laceration without foreign body of left elbow, initial encounter: Secondary | ICD-10-CM | POA: Insufficient documentation

## 2023-04-18 DIAGNOSIS — Z743 Need for continuous supervision: Secondary | ICD-10-CM | POA: Diagnosis not present

## 2023-04-18 DIAGNOSIS — S0121XA Laceration without foreign body of nose, initial encounter: Secondary | ICD-10-CM | POA: Diagnosis not present

## 2023-04-18 DIAGNOSIS — M25461 Effusion, right knee: Secondary | ICD-10-CM | POA: Diagnosis not present

## 2023-04-18 DIAGNOSIS — S0219XA Other fracture of base of skull, initial encounter for closed fracture: Secondary | ICD-10-CM | POA: Diagnosis not present

## 2023-04-18 DIAGNOSIS — Y92410 Unspecified street and highway as the place of occurrence of the external cause: Secondary | ICD-10-CM | POA: Diagnosis not present

## 2023-04-18 DIAGNOSIS — Y9355 Activity, bike riding: Secondary | ICD-10-CM | POA: Diagnosis not present

## 2023-04-18 DIAGNOSIS — Z23 Encounter for immunization: Secondary | ICD-10-CM | POA: Insufficient documentation

## 2023-04-18 DIAGNOSIS — S0291XA Unspecified fracture of skull, initial encounter for closed fracture: Secondary | ICD-10-CM | POA: Diagnosis not present

## 2023-04-18 DIAGNOSIS — J3489 Other specified disorders of nose and nasal sinuses: Secondary | ICD-10-CM | POA: Diagnosis not present

## 2023-04-18 DIAGNOSIS — G501 Atypical facial pain: Secondary | ICD-10-CM | POA: Diagnosis not present

## 2023-04-18 DIAGNOSIS — S81812A Laceration without foreign body, left lower leg, initial encounter: Secondary | ICD-10-CM | POA: Diagnosis not present

## 2023-04-18 DIAGNOSIS — Z7982 Long term (current) use of aspirin: Secondary | ICD-10-CM | POA: Diagnosis not present

## 2023-04-18 DIAGNOSIS — S60812A Abrasion of left wrist, initial encounter: Secondary | ICD-10-CM | POA: Diagnosis not present

## 2023-04-18 DIAGNOSIS — S022XXA Fracture of nasal bones, initial encounter for closed fracture: Secondary | ICD-10-CM | POA: Diagnosis not present

## 2023-04-18 DIAGNOSIS — R6889 Other general symptoms and signs: Secondary | ICD-10-CM | POA: Diagnosis not present

## 2023-04-18 DIAGNOSIS — M1711 Unilateral primary osteoarthritis, right knee: Secondary | ICD-10-CM | POA: Diagnosis not present

## 2023-04-18 DIAGNOSIS — Z041 Encounter for examination and observation following transport accident: Secondary | ICD-10-CM | POA: Diagnosis not present

## 2023-04-18 DIAGNOSIS — S199XXA Unspecified injury of neck, initial encounter: Secondary | ICD-10-CM | POA: Diagnosis not present

## 2023-04-18 LAB — CBC
HCT: 42.2 % (ref 39.0–52.0)
Hemoglobin: 14.6 g/dL (ref 13.0–17.0)
MCH: 34.4 pg — ABNORMAL HIGH (ref 26.0–34.0)
MCHC: 34.6 g/dL (ref 30.0–36.0)
MCV: 99.3 fL (ref 80.0–100.0)
Platelets: 115 10*3/uL — ABNORMAL LOW (ref 150–400)
RBC: 4.25 MIL/uL (ref 4.22–5.81)
RDW: 12.4 % (ref 11.5–15.5)
WBC: 10.5 10*3/uL (ref 4.0–10.5)
nRBC: 0 % (ref 0.0–0.2)

## 2023-04-18 LAB — COMPREHENSIVE METABOLIC PANEL
ALT: 26 U/L (ref 0–44)
AST: 22 U/L (ref 15–41)
Albumin: 4 g/dL (ref 3.5–5.0)
Alkaline Phosphatase: 45 U/L (ref 38–126)
Anion gap: 12 (ref 5–15)
BUN: 15 mg/dL (ref 8–23)
CO2: 19 mmol/L — ABNORMAL LOW (ref 22–32)
Calcium: 9.4 mg/dL (ref 8.9–10.3)
Chloride: 106 mmol/L (ref 98–111)
Creatinine, Ser: 0.85 mg/dL (ref 0.61–1.24)
GFR, Estimated: >60 mL/min (ref >60)
Glucose, Bld: 121 mg/dL — ABNORMAL HIGH (ref 70–99)
Potassium: 4.3 mmol/L (ref 3.5–5.1)
Sodium: 137 mmol/L (ref 135–145)
Total Bilirubin: 1.5 mg/dL — ABNORMAL HIGH (ref 0.3–1.2)
Total Protein: 6.6 g/dL (ref 6.5–8.1)

## 2023-04-18 MED ORDER — LIDOCAINE-EPINEPHRINE (PF) 2 %-1:200000 IJ SOLN
20.0000 mL | Freq: Once | INTRAMUSCULAR | Status: DC
  Filled 2023-04-18: qty 20

## 2023-04-18 MED ORDER — TETANUS-DIPHTH-ACELL PERTUSSIS 5-2.5-18.5 LF-MCG/0.5 IM SUSY
0.5000 mL | PREFILLED_SYRINGE | Freq: Once | INTRAMUSCULAR | Status: AC
  Administered 2023-04-18: 0.5 mL via INTRAMUSCULAR
  Filled 2023-04-18: qty 0.5

## 2023-04-18 MED ORDER — CEPHALEXIN 500 MG PO CAPS: 500.0000 mg | ORAL_CAPSULE | Freq: Four times a day (QID) | ORAL | 0 refills | Status: DC

## 2023-04-18 MED ORDER — ONDANSETRON 4 MG PO TBDP
8.0000 mg | ORAL_TABLET | Freq: Once | ORAL | Status: AC
  Administered 2023-04-18: 8 mg via ORAL
  Filled 2023-04-18: qty 2
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
## 2023-04-18 NOTE — Discharge Instructions (Addendum)
Please call Dr. Harvie Junior office on Monday to schedule for follow-up Follow-up with your doctor for recheck of blood pressure Use acetaminophen, elevation, and cold therapy for injuries of the face Use pressure and elevation of right knee for swelling Keep abrasions clean and dry Return to the emergency department if you are having any symptoms of infection of your lacerations or abrasions such as increased redness, swelling, pus, streaking, or fever Return if you are having any increased headache, confusion, or lateralized weakness Sutures should be removed in about 7 days.

## 2023-04-18 NOTE — ED Notes (Signed)
Pt requesting water. RN notified EDP and informed pt scans needs to be read first.
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
## 2023-04-18 NOTE — ED Notes (Addendum)
Pt has 2+ swelling of right knee and 1+ swelling of left knee. Pt has abrasion of right knee, left wrist and right elbow and left elbow. I cleaned all of those wounds w/NS and applied a dry dressing and kerlex to all of them. Pt has hematoma of left shin.
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
## 2023-04-21 ENCOUNTER — Other Ambulatory Visit: Payer: Self-pay | Admitting: Surgery

## 2023-04-21 DIAGNOSIS — I7121 Aneurysm of the ascending aorta, without rupture: Secondary | ICD-10-CM

## 2023-04-23 DIAGNOSIS — S0219XA Other fracture of base of skull, initial encounter for closed fracture: Secondary | ICD-10-CM | POA: Diagnosis not present

## 2023-04-23 DIAGNOSIS — S022XXA Fracture of nasal bones, initial encounter for closed fracture: Secondary | ICD-10-CM | POA: Diagnosis not present

## 2023-04-23 DIAGNOSIS — S0121XA Laceration without foreign body of nose, initial encounter: Secondary | ICD-10-CM | POA: Diagnosis not present

## 2023-04-23 DIAGNOSIS — M952 Other acquired deformity of head: Secondary | ICD-10-CM | POA: Diagnosis not present

## 2023-04-24 ENCOUNTER — Telehealth: Payer: Self-pay

## 2023-04-24 NOTE — Telephone Encounter (Signed)
Transition Care Management Unsuccessful Follow-up Telephone Call  Date of discharge and from where:  [facility] 8/10  Attempts:  2nd Attempt  Reason for unsuccessful TCM follow-up call:  No answer/busy   Lenard Forth [facility] Guide, [facility] [phone] [address] [place], [place], [place] [address] Phone: [phone] Email: [email or web address]

## 2023-04-24 NOTE — Telephone Encounter (Signed)
Transition Care Management Unsuccessful Follow-up Telephone Call  Date of discharge and from where:  [facility] 8/10  Attempts:  1st Attempt  Reason for unsuccessful TCM follow-up call:  No answer/busy   Lenard Forth [facility] Guide, [facility] [phone] [address] [place], [place], [place] [address] Phone: [phone] Email: [email or web address]

## 2023-04-30 DIAGNOSIS — H35373 Puckering of macula, bilateral: Secondary | ICD-10-CM | POA: Diagnosis not present

## 2023-04-30 DIAGNOSIS — H43822 Vitreomacular adhesion, left eye: Secondary | ICD-10-CM | POA: Diagnosis not present

## 2023-04-30 DIAGNOSIS — G453 Amaurosis fugax: Secondary | ICD-10-CM | POA: Diagnosis not present

## 2023-04-30 DIAGNOSIS — H35351 Cystoid macular degeneration, right eye: Secondary | ICD-10-CM | POA: Diagnosis not present

## 2023-05-27 ENCOUNTER — Ambulatory Visit: Payer: Medicare Other | Admitting: Surgery

## 2023-05-27 ENCOUNTER — Ambulatory Visit
Admission: RE | Admit: 2023-05-27 | Discharge: 2023-05-27 | Disposition: A | Discharge status: Home / Self Care | Payer: Medicare Other | Source: Ambulatory Visit | Attending: Surgery | Admitting: Surgery

## 2023-05-27 DIAGNOSIS — I719 Aortic aneurysm of unspecified site, without rupture: Secondary | ICD-10-CM | POA: Diagnosis not present

## 2023-05-27 DIAGNOSIS — I7121 Aneurysm of the ascending aorta, without rupture: Secondary | ICD-10-CM

## 2023-05-27 MED ORDER — IOPAMIDOL (ISOVUE-370) INJECTION 76%
200.0000 mL | Freq: Once | INTRAVENOUS | Status: AC | PRN
  Administered 2023-05-27: 75 mL via INTRAVENOUS

## 2023-05-28 ENCOUNTER — Encounter: Payer: Self-pay | Admitting: Surgery

## 2023-05-28 ENCOUNTER — Ambulatory Visit: Payer: Medicare Other | Admitting: Surgery

## 2023-05-28 VITALS — BP 144/96 | HR 64 | Resp 20 | Ht 71.0 in | Wt 183.0 lb

## 2023-05-28 DIAGNOSIS — I7121 Aneurysm of the ascending aorta, without rupture: Secondary | ICD-10-CM
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
## 2023-06-02 ENCOUNTER — Ambulatory Visit (INDEPENDENT_AMBULATORY_CARE_PROVIDER_SITE_OTHER): Payer: Medicare Other | Admitting: Family Medicine

## 2023-06-02 VITALS — BP 134/60 | HR 71 | Temp 98.4°F | Ht 71.0 in | Wt 177.7 lb

## 2023-06-02 DIAGNOSIS — R42 Dizziness and giddiness: Secondary | ICD-10-CM

## 2023-06-02 DIAGNOSIS — S0219XD Other fracture of base of skull, subsequent encounter for fracture with routine healing: Secondary | ICD-10-CM

## 2023-06-02 NOTE — Patient Instructions (Signed)
I will set up neurology referral.    ?
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
## 2023-06-08 ENCOUNTER — Encounter: Payer: Self-pay | Admitting: Neurology

## 2023-07-15 ENCOUNTER — Encounter: Payer: Self-pay | Admitting: Neurology

## 2023-07-15 ENCOUNTER — Other Ambulatory Visit (INDEPENDENT_AMBULATORY_CARE_PROVIDER_SITE_OTHER): Payer: Medicare Other

## 2023-07-15 ENCOUNTER — Ambulatory Visit: Payer: Medicare Other | Admitting: Neurology

## 2023-07-15 VITALS — BP 131/78 | HR 77 | Ht 70.0 in | Wt 179.0 lb

## 2023-07-15 DIAGNOSIS — I951 Orthostatic hypotension: Secondary | ICD-10-CM

## 2023-07-15 DIAGNOSIS — S0990XS Unspecified injury of head, sequela: Secondary | ICD-10-CM

## 2023-07-15 DIAGNOSIS — R42 Dizziness and giddiness: Secondary | ICD-10-CM

## 2023-07-15 DIAGNOSIS — H9190 Unspecified hearing loss, unspecified ear: Secondary | ICD-10-CM

## 2023-07-15 LAB — VITAMIN B12: Vitamin B-12: 221 pg/mL (ref 211–911)
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
## 2023-07-20 LAB — VITAMIN B1: Vitamin B1 (Thiamine): 14 nmol/L (ref 8–30)

## 2023-07-21 DIAGNOSIS — M25511 Pain in right shoulder: Secondary | ICD-10-CM | POA: Diagnosis not present

## 2023-07-24 DIAGNOSIS — J329 Chronic sinusitis, unspecified: Secondary | ICD-10-CM | POA: Diagnosis not present

## 2023-07-24 DIAGNOSIS — S0219XS Other fracture of base of skull, sequela: Secondary | ICD-10-CM | POA: Diagnosis not present

## 2023-07-24 DIAGNOSIS — S0219XA Other fracture of base of skull, initial encounter for closed fracture: Secondary | ICD-10-CM | POA: Diagnosis not present

## 2023-07-24 DIAGNOSIS — J342 Deviated nasal septum: Secondary | ICD-10-CM | POA: Diagnosis not present

## 2023-07-24 DIAGNOSIS — H7292 Unspecified perforation of tympanic membrane, left ear: Secondary | ICD-10-CM | POA: Diagnosis not present

## 2023-07-24 DIAGNOSIS — M952 Other acquired deformity of head: Secondary | ICD-10-CM | POA: Diagnosis not present

## 2023-07-24 DIAGNOSIS — R9089 Other abnormal findings on diagnostic imaging of central nervous system: Secondary | ICD-10-CM | POA: Diagnosis not present

## 2023-07-24 DIAGNOSIS — J324 Chronic pansinusitis: Secondary | ICD-10-CM | POA: Diagnosis not present

## 2023-07-24 DIAGNOSIS — H9212 Otorrhea, left ear: Secondary | ICD-10-CM | POA: Diagnosis not present

## 2023-07-31 DIAGNOSIS — S0219XS Other fracture of base of skull, sequela: Secondary | ICD-10-CM | POA: Diagnosis not present

## 2023-07-31 DIAGNOSIS — J324 Chronic pansinusitis: Secondary | ICD-10-CM | POA: Diagnosis not present

## 2023-07-31 DIAGNOSIS — M952 Other acquired deformity of head: Secondary | ICD-10-CM | POA: Diagnosis not present

## 2023-08-04 DIAGNOSIS — H6692 Otitis media, unspecified, left ear: Secondary | ICD-10-CM | POA: Diagnosis not present

## 2023-08-11 DIAGNOSIS — M25511 Pain in right shoulder: Secondary | ICD-10-CM | POA: Diagnosis not present

## 2023-08-18 DIAGNOSIS — M75101 Unspecified rotator cuff tear or rupture of right shoulder, not specified as traumatic: Secondary | ICD-10-CM | POA: Diagnosis not present

## 2023-12-15 ENCOUNTER — Ambulatory Visit (INDEPENDENT_AMBULATORY_CARE_PROVIDER_SITE_OTHER): Payer: Medicare Other | Admitting: Family Medicine

## 2023-12-15 DIAGNOSIS — Z0000 Encounter for general adult medical examination without abnormal findings: Secondary | ICD-10-CM | POA: Diagnosis not present

## 2023-12-15 NOTE — Patient Instructions (Signed)
 I really enjoyed getting to talk with you today! I am available on Tuesdays and Thursdays for virtual visits if you have any questions or concerns, or if I can be of any further assistance.   CHECKLIST FROM ANNUAL WELLNESS VISIT:  -Follow up (please call to schedule if not scheduled after visit):   -yearly for annual wellness visit with primary care office  Here is a list of your preventive care/health maintenance measures and the plan for each if any are due:  PLAN For any measures below that may be due:  -can get the vaccines at the pharmacy, please let us know if your do so that we can update your records  Health Maintenance  Topic Date Due   Zoster Vaccines- Shingrix (1 of 2) Never done   COVID-19 Vaccine (3 - 2024-25 season) 05/10/2023   INFLUENZA VACCINE  04/08/2024   Medicare Annual Wellness (AWV)  12/14/2024   DTaP/Tdap/Td (4 - Td or Tdap) 04/17/2033   Pneumonia Vaccine 59+ Years old  Completed   HPV VACCINES  Aged Out    -See a dentist at least yearly  -Get your eyes checked and then per your eye specialist's recommendations  -Other issues addressed today:   -I have included below further information regarding a healthy whole foods based diet, physical activity guidelines for adults, stress management and opportunities for social connections. I hope you find this information useful.   -----------------------------------------------------------------------------------------------------------------------------------------------------------------------------------------------------------------------------------------------------------    NUTRITION: -eat real food: lots of colorful vegetables (half the plate) and fruits -5-7 servings of vegetables and fruits per day (fresh or steamed is best), exp. 2 servings of vegetables with lunch and dinner and 2 servings of fruit per day. Berries and greens such as kale and collards are great choices.  -consume on a regular basis:   fresh fruits, fresh veggies, fish, nuts, seeds, healthy oils (such as olive oil, avocado oil), whole grains (make sure for bread/pasta/crackers/etc., that the first ingredient on label contains the word "whole"), legumes. -can eat small amounts of dairy and lean meat (no larger than the palm of your hand), but avoid processed meats such as ham, bacon, lunch meat, etc. -drink water -try to avoid fast food and pre-packaged foods, processed meat, ultra processed foods/beverages (donuts, candy, etc.) -most experts advise limiting sodium to < 2300mg  per day, should limit further is any chronic conditions such as high blood pressure, heart disease, diabetes, etc. The American Heart Association advised that < 1500mg  is is ideal -try to avoid foods/beverages that contain any ingredients with names you do not recognize  -try to avoid foods/beverages  with added sugar or sweeteners/sweets  -try to avoid sweet drinks (including diet drinks): soda, juice, Gatorade, sweet tea, power drinks, diet drinks -try to avoid white rice, white bread, pasta (unless whole grain)  EXERCISE GUIDELINES FOR ADULTS: -if you wish to increase your physical activity, do so gradually and with the approval of your doctor -STOP and seek medical care immediately if you have any chest pain, chest discomfort or trouble breathing when starting or increasing exercise  -move and stretch your body, legs, feet and arms when sitting for long periods -Physical activity guidelines for optimal health in adults: -get at least 150 minutes per week of moderate exercise (can talk, but not sing); this is about 20-30 minutes of sustained activity 5-7 days per week or two 10-15 minute episodes of sustained activity 5-7 days per week -do some muscle building/resistance training/strength training at least 2 days per week  -balance exercises  3+ days per week:   Stand somewhere where you have something sturdy to hold onto if you lose balance    1) lift up  on toes, then back down, start with 5x per day and work up to 20x   2) stand and lift one leg straight out to the side so that foot is a few inches of the floor, start with 5x each side and work up to 20x each side   3) stand on one foot, start with 5 seconds each side and work up to 20 seconds on each side  If you need ideas or help with getting more active:  -Silver sneakers https://tools.silversneakers.com  -Walk with a Doc: http://www.duncan-williams.com/  -try to include resistance (weight lifting/strength building) and balance exercises twice per week: or the following link for ideas: http://castillo-powell.com/  BuyDucts.dk  STRESS MANAGEMENT: -can try meditating, or just sitting quietly with deep breathing while intentionally relaxing all parts of your body for 5 minutes daily -if you need further help with stress, anxiety or depression please follow up with your primary doctor or contact the wonderful folks at [facility]: [phone]  SOCIAL CONNECTIONS: -options in [place] if you wish to engage in more social and exercise related activities:  -Silver sneakers https://tools.silversneakers.com  -Walk with a Doc: http://www.duncan-williams.com/  -Check out the [facility] Active Adults 50+ section on the [place] [employer] (hiking clubs, book clubs, cards and games, chess, exercise classes, aquatic classes and much more) - see the website for details: https://www.[place].gov/departments/parks-recreation/active-adults50  -YouTube has lots of exercise videos for different ages and abilities as well  -[facility] (a variety of indoor and outdoor inperson activities for adults). (848)644-0598. [address]  -Virtual Online Classes (a variety of topics): see seniorplanet.org or call [phone]  -consider volunteering at a school, hospice center,  church, senior center or elsewhere
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
## 2024-03-04 DIAGNOSIS — H6692 Otitis media, unspecified, left ear: Secondary | ICD-10-CM | POA: Diagnosis not present

## 2024-03-04 DIAGNOSIS — H9212 Otorrhea, left ear: Secondary | ICD-10-CM | POA: Diagnosis not present

## 2024-03-18 DIAGNOSIS — Z961 Presence of intraocular lens: Secondary | ICD-10-CM | POA: Diagnosis not present

## 2024-03-18 DIAGNOSIS — H52203 Unspecified astigmatism, bilateral: Secondary | ICD-10-CM | POA: Diagnosis not present

## 2024-03-18 DIAGNOSIS — H26491 Other secondary cataract, right eye: Secondary | ICD-10-CM | POA: Diagnosis not present

## 2024-03-23 DIAGNOSIS — H26491 Other secondary cataract, right eye: Secondary | ICD-10-CM | POA: Diagnosis not present

## 2024-04-12 DIAGNOSIS — H6692 Otitis media, unspecified, left ear: Secondary | ICD-10-CM | POA: Diagnosis not present

## 2024-04-15 ENCOUNTER — Other Ambulatory Visit: Payer: Self-pay | Admitting: Surgery

## 2024-04-15 DIAGNOSIS — I7121 Aneurysm of the ascending aorta, without rupture: Secondary | ICD-10-CM

## 2024-05-25 ENCOUNTER — Ambulatory Visit (HOSPITAL_COMMUNITY)
Admission: RE | Admit: 2024-05-25 | Discharge: 2024-05-25 | Disposition: A | Discharge status: Home / Self Care | Source: Ambulatory Visit | Attending: Cardiovascular Disease | Admitting: Cardiovascular Disease

## 2024-05-25 DIAGNOSIS — Z09 Encounter for follow-up examination after completed treatment for conditions other than malignant neoplasm: Secondary | ICD-10-CM | POA: Insufficient documentation

## 2024-05-25 DIAGNOSIS — I2510 Atherosclerotic heart disease of native coronary artery without angina pectoris: Secondary | ICD-10-CM | POA: Insufficient documentation

## 2024-05-25 DIAGNOSIS — I7121 Aneurysm of the ascending aorta, without rupture: Secondary | ICD-10-CM | POA: Insufficient documentation

## 2024-05-25 DIAGNOSIS — I700 Atherosclerosis of aorta: Secondary | ICD-10-CM | POA: Insufficient documentation

## 2024-05-25 MED ORDER — IOHEXOL 350 MG/ML SOLN
100.0000 mL | Freq: Once | INTRAVENOUS | Status: AC | PRN
  Administered 2024-05-25: 100 mL via INTRAVENOUS

## 2024-06-01 ENCOUNTER — Ambulatory Visit: Admitting: Surgery

## 2024-06-01 ENCOUNTER — Ambulatory Visit

## 2024-06-01 VITALS — BP 109/66 | HR 84 | Resp 20 | Wt 182.0 lb

## 2024-06-01 DIAGNOSIS — I7121 Aneurysm of the ascending aorta, without rupture: Secondary | ICD-10-CM | POA: Diagnosis not present

## 2024-06-01 NOTE — Progress Notes (Signed)
 [address] [place] [address]             [phone]            UNNAMED ZEIEN [id number] Jul 25, 1941   History of Present Illness:  Jayshon Dommer is a 83 year old man with medical history of iliac aneurysm, amaurosis fugax of right eye, BPH, and hyperlipidemia who presents for continued follow up of ascending thoracic aortic aneurysm. He has been followed by our clinic since 2021 and aneurysm has stable in size measuring 4.7 cm to 4.9 cm. Aneurysm measured 4.9 cm on CTA of chest on 05/25/2024. Echocardiogram in 2021 showed tricuspid aortic valve.   He presents today to the clinic with his wife and reports that he has been doing well.  His blood pressure is well controlled without the use of medications.  He is active with yard work and denies heavy lifting.  He denies chest pain, shortness of breath and lower leg swelling.    Current Outpatient Medications on File Prior to Visit  Medication Sig Dispense Refill   aspirin  EC 81 MG tablet Take 81 mg by mouth daily. Swallow whole.     Cholecalciferol (VITAMIN D3 PO) Take 1 tablet by mouth daily.     No current facility-administered medications on file prior to visit.     ROS: Review of Systems  Constitutional: Negative.  Negative for malaise/fatigue.  Respiratory: Negative.  Negative for cough, shortness of breath and wheezing.   Cardiovascular: Negative.  Negative for chest pain, palpitations and leg swelling.  Neurological: Negative.  Negative for dizziness.     BP 109/66   Pulse 84   Resp 20   Wt 182 lb (82.6 kg)   SpO2 95% Comment: RA  BMI 26.11 kg/m   Physical Exam Constitutional:      Appearance: Normal appearance.  HENT:     Head: Normocephalic and atraumatic.  Skin:    General: Skin is warm and dry.  Neurological:     General: No focal deficit present.     Mental Status: He is alert and oriented to person, place, and time.      Imaging: CLINICAL DATA:  Follow-up  thoracic aortic aneurysm.   EXAM: CT ANGIOGRAPHY CHEST WITH CONTRAST   TECHNIQUE: Multidetector CT imaging of the chest was performed using the standard protocol during bolus administration of intravenous contrast. Multiplanar CT image reconstructions and MIPs were obtained to evaluate the vascular anatomy.   RADIATION DOSE REDUCTION: This exam was performed according to the departmental dose-optimization program which includes automated exposure control, adjustment of the mA and/or kV according to patient size and/or use of iterative reconstruction technique.   CONTRAST:  OMNIPAQUE  IOHEXOL  350 MG/ML SOLN   COMPARISON:  05/27/2023   FINDINGS: Cardiovascular: Ascending thoracic aortic aneurysm again noted measuring 4.9 cm and stable since prior study. No dissection. Coronary artery and aortic atherosclerosis. Heart is normal size.   Mediastinum/Nodes: No mediastinal, hilar, or axillary adenopathy. Trachea and esophagus are unremarkable. Thyroid  unremarkable.   Lungs/Pleura: No confluent airspace opacity or effusion. Coarsened interstitial prominence peripherally in the lungs, likely mild chronic lung disease.   Upper Abdomen: No acute findings   Musculoskeletal: Chest wall soft tissues are unremarkable. No acute bony abnormality.   Review of the MIP images confirms the above findings.   IMPRESSION: Stable 4.9 cm ascending thoracic aortic aneurysm. Recommend semi-annual imaging followup by CTA or MRA. This recommendation  follows 2010 ACCF/AHA/AATS/ACR/ASA/SCA/SCAI/SIR/STS/SVM Guidelines for the Diagnosis and Management of Patients With Thoracic Aortic Disease. Circulation. 2010; 121: [id number]. Aortic aneurysm NOS (ICD10-I71.9)   Coronary artery disease.   No acute cardiopulmonary disease.   Aortic Atherosclerosis (ICD10-I70.0).     Electronically Signed   By: Franky Crease M.D.   On: 05/25/2024 14:57     A/P: Aneurysm of ascending aorta without  rupture -4.9 cm ascending thoracic aortic aneurysm on CTA of chest. We discussed the natural history and and risk factors for growth of ascending aortic aneurysms. Discussed recommendations to minimize the risk of further expansion or dissection including careful blood pressure control, avoidance of contact sports and heavy lifting, attention to lipid management.  We covered the importance of staying never user of tobacco.  The patient does not yet meet surgical criteria of >5.5cm. The patient is aware of signs and symptoms of aortic dissection and when to present to the emergency department   -Follow up in one year with CTA of chest    Risk Modification:  Statin:  not currently prescribed  Smoking cessation instruction/counseling given:  never user  Patient was counseled on importance of Blood Pressure Control  They are instructed to contact their Primary Care Physician if they start to have blood pressure readings over 130s/90s. Do not ever stop blood pressure medications on your own, unless instructed by healthcare professional.  Please avoid use of Fluoroquinolones as this can potentially increase your risk of Aortic Rupture and/or Dissection  Patient educated on signs and symptoms of Aortic Dissection, handout also provided in AVS  Manuelita CHRISTELLA Rough, PA-C 06/01/24

## 2024-06-01 NOTE — Patient Instructions (Signed)
